# Patient Record
Sex: Male | Born: 1960 | Race: Black or African American | Hispanic: No | Marital: Married | Smoking: Never smoker
Health system: Southern US, Community
[De-identification: ages and names within clinical notes are randomized; demographics above are authoritative.]

## PROBLEM LIST (undated history)

## (undated) DIAGNOSIS — J449 Chronic obstructive pulmonary disease, unspecified: Secondary | ICD-10-CM

## (undated) DIAGNOSIS — N401 Enlarged prostate with lower urinary tract symptoms: Secondary | ICD-10-CM

## (undated) DIAGNOSIS — J302 Other seasonal allergic rhinitis: Secondary | ICD-10-CM

## (undated) DIAGNOSIS — C61 Malignant neoplasm of prostate: Secondary | ICD-10-CM

## (undated) DIAGNOSIS — I10 Essential (primary) hypertension: Secondary | ICD-10-CM

## (undated) DIAGNOSIS — M545 Low back pain: Secondary | ICD-10-CM

## (undated) DIAGNOSIS — E785 Hyperlipidemia, unspecified: Secondary | ICD-10-CM

## (undated) DIAGNOSIS — Z8619 Personal history of other infectious and parasitic diseases: Secondary | ICD-10-CM

## (undated) HISTORY — PX: PROSTATE BIOPSY: SHX241

## (undated) HISTORY — PX: NO PAST SURGERIES: SHX2092

## (undated) HISTORY — DX: Other seasonal allergic rhinitis: J30.2

## (undated) HISTORY — DX: Personal history of other infectious and parasitic diseases: Z86.19

## (undated) HISTORY — DX: Low back pain: M54.5

---

## 2013-01-22 ENCOUNTER — Encounter (HOSPITAL_COMMUNITY): Payer: Self-pay | Admitting: Emergency Medicine

## 2013-01-22 ENCOUNTER — Emergency Department (HOSPITAL_COMMUNITY): Payer: Medicare HMO

## 2013-01-22 ENCOUNTER — Emergency Department (HOSPITAL_COMMUNITY)
Admission: EM | Admit: 2013-01-22 | Discharge: 2013-01-22 | Disposition: A | Discharge status: Home / Self Care | Payer: Medicare HMO | Attending: Emergency Medicine | Admitting: Emergency Medicine

## 2013-01-22 DIAGNOSIS — R109 Unspecified abdominal pain: Secondary | ICD-10-CM | POA: Insufficient documentation

## 2013-01-22 DIAGNOSIS — M549 Dorsalgia, unspecified: Secondary | ICD-10-CM | POA: Insufficient documentation

## 2013-01-22 DIAGNOSIS — R1030 Lower abdominal pain, unspecified: Secondary | ICD-10-CM

## 2013-01-22 DIAGNOSIS — E785 Hyperlipidemia, unspecified: Secondary | ICD-10-CM | POA: Insufficient documentation

## 2013-01-22 DIAGNOSIS — I10 Essential (primary) hypertension: Secondary | ICD-10-CM | POA: Insufficient documentation

## 2013-01-22 HISTORY — DX: Essential (primary) hypertension: I10

## 2013-01-22 HISTORY — DX: Hyperlipidemia, unspecified: E78.5

## 2013-01-22 LAB — URINALYSIS, ROUTINE W REFLEX MICROSCOPIC
Hgb urine dipstick: NEGATIVE
Ketones, ur: NEGATIVE mg/dL
Leukocytes, UA: NEGATIVE
Protein, ur: NEGATIVE mg/dL
Urobilinogen, UA: 0.2 mg/dL (ref 0.0–1.0)

## 2013-01-22 LAB — CBC WITH DIFFERENTIAL/PLATELET
Basophils Absolute: 0.1 10*3/uL (ref 0.0–0.1)
Eosinophils Absolute: 0.2 10*3/uL (ref 0.0–0.7)
Eosinophils Relative: 2 % (ref 0–5)
MCH: 27.2 pg (ref 26.0–34.0)
MCHC: 33.5 g/dL (ref 30.0–36.0)
MCV: 81.1 fL (ref 78.0–100.0)
Monocytes Absolute: 1.3 10*3/uL — ABNORMAL HIGH (ref 0.1–1.0)
Platelets: 290 10*3/uL (ref 150–400)
RDW: 13.7 % (ref 11.5–15.5)

## 2013-01-22 LAB — BASIC METABOLIC PANEL
Calcium: 9.7 mg/dL (ref 8.4–10.5)
Creatinine, Ser: 1.2 mg/dL (ref 0.50–1.35)
GFR calc non Af Amer: 69 mL/min — ABNORMAL LOW (ref >90)
Sodium: 134 mEq/L — ABNORMAL LOW (ref 135–145)

## 2013-01-22 MED ORDER — OXYCODONE-ACETAMINOPHEN 5-325 MG PO TABS
2.0000 | ORAL_TABLET | Freq: Once | ORAL | Status: AC
  Administered 2013-01-22: 2 via ORAL
  Filled 2013-01-22: qty 2

## 2013-01-22 MED ORDER — OXYCODONE-ACETAMINOPHEN 5-325 MG PO TABS: 1.0000 | ORAL_TABLET | ORAL | Status: DC | PRN

## 2013-01-22 NOTE — ED Provider Notes (Signed)
History    CSN: [id number] Arrival date & time 01/22/13  1459  First MD Initiated Contact with Patient 01/22/13 1546     Chief Complaint  Patient presents with  . Groin Pain  . Back Pain    The history is provided by the patient.   patient began having bilateral radiating groin pain it radiates towards his scrotum and his perineal area that began today.  His pain began somewhat abruptly and this continued to worsen through the day.  It seems as though walking exacerbates his pain more than anything else.  No frank penile pain.  He denies dysuria or urinary frequency.  No fevers or chills.  No nausea or vomiting.  No diarrhea.  His pain is moderate to severe in severity at this time.  Nothing improves his pain.  He took both Aleve and ibuprofen prior to arrival without improvement in his symptoms   Past Medical History  Diagnosis Date  . Hypertension   . Hyperlipidemia    History reviewed. No pertinent past surgical history. No family history on file. History  Substance Use Topics  . Smoking status: Never Smoker   . Smokeless tobacco: Not on file  . Alcohol Use: No    Review of Systems  Musculoskeletal: Positive for back pain.  All other systems reviewed and are negative.    Allergies  Review of patient's allergies indicates no known allergies.  Home Medications  No current outpatient prescriptions on file. BP 118/70  Pulse 83  Temp(Src) 98.7 F (37.1 C) (Oral)  SpO2 96% Physical Exam  Nursing note and vitals reviewed. Constitutional: He is oriented to person, place, and time. He appears well-developed and well-nourished.  HENT:  Head: Normocephalic and atraumatic.  Eyes: EOM are normal.  Neck: Normal range of motion.  Cardiovascular: Normal rate, regular rhythm, normal heart sounds and intact distal pulses.   Pulmonary/Chest: Effort normal and breath sounds normal. No respiratory distress.  Abdominal: Soft. He exhibits no distension. There is no tenderness.   Genitourinary: Rectum normal.  Normal uncircumcised penis.  No tenderness along the shaft of his penis.  Normal scrotum.  Normal testicles bilaterally.  Normal perineal area without erythema warmth or tenderness or crepitus.  Musculoskeletal: Normal range of motion.  No lumbar or paralumbar tenderness.  Exacerbation of pain with abduction of his legs.   Neurological: He is alert and oriented to person, place, and time.  Skin: Skin is warm and dry.  Psychiatric: He has a normal mood and affect. Judgment normal.    ED Course  Procedures (including critical care time) Labs Reviewed  CBC WITH DIFFERENTIAL - Abnormal; Notable for the following:    WBC 12.1 (*)    Neutro Abs 9.0 (*)    Monocytes Absolute 1.3 (*)    All other components within normal limits  BASIC METABOLIC PANEL - Abnormal; Notable for the following:    Sodium 134 (*)    GFR calc non Af Amer 69 (*)    GFR calc Af Amer 80 (*)    All other components within normal limits  URINALYSIS, ROUTINE W REFLEX MICROSCOPIC   Ct Abdomen Pelvis Wo Contrast  01/22/2013   *RADIOLOGY REPORT*  Clinical Data: Groin pain and low back pain.  CT ABDOMEN AND PELVIS WITHOUT CONTRAST  Technique:  Multidetector CT imaging of the abdomen and pelvis was performed following the standard protocol without intravenous contrast.  Comparison: None.  Findings: There is no evidence of renal, ureteral or bladder calculi.  No  hydronephrosis identified.  Unenhanced appearance of the liver, gallbladder, pancreas, spleen, adrenal glands and bowel are unremarkable.  No abnormal fluid collections are identified.  No evidence of hernias, masses or enlarged lymph nodes.  The abdominal aorta is of normal caliber. Bony structures are unremarkable.  IMPRESSION: Normal CT of the abdomen and pelvis without contrast.   Original Report Authenticated By: Irish Lack, M.D.   1. Groin pain     MDM  I suspect this patient's pain is more related to musculoskeletal groin pain  possibly from working on his car yesterday.  However given his age and significant pain it is worthwhile to do a CT scan through his abdomen and pelvis to look for alternate pathology including the possibility of free intraperitoneal fluid that could be causing diffuse discomfort in his lower pelvis.  5:41 PM Pt feels better at this time, labs, urine and CT scan without acute pathology  Lyanne Co, MD 01/22/13 [phone]

## 2013-01-22 NOTE — ED Notes (Signed)
States that he began having groin pain and low back pain this am. States that when he walks the pain is worse. Denies dysuria or recent fall.

## 2013-01-22 NOTE — ED Notes (Signed)
Patient transported to CT 

## 2013-01-22 NOTE — Progress Notes (Signed)
   CARE MANAGEMENT ED NOTE 01/22/2013  Patient:  David [name],David [name]   Account Number:  [id number]  Date Initiated:  01/22/2013  Documentation initiated by:  Radford Pax  Subjective/Objective Assessment:   Patient presented to ED with groin and lower back pain     Subjective/Objective Assessment Detail:     Action/Plan:   Action/Plan Detail:   Anticipated DC Date:       Status Recommendation to Physician:   Result of Recommendation:    Other ED Services  Consult Working Plan    DC Planning Services  Other  PCP issues    Choice offered to / List presented to:            Status of service:  Completed, signed off  ED Comments:   ED Comments Detail:  Patient listed as not having a pcp.  EDCM spoke to patient who stated that David [name] of [facility] is his pcp. No further needs at this time.

## 2013-01-26 ENCOUNTER — Emergency Department (HOSPITAL_COMMUNITY)
Admission: EM | Admit: 2013-01-26 | Discharge: 2013-01-26 | Disposition: A | Discharge status: Home / Self Care | Payer: Managed Care, Other (non HMO) | Attending: Emergency Medicine | Admitting: Emergency Medicine

## 2013-01-26 ENCOUNTER — Encounter (HOSPITAL_COMMUNITY): Payer: Self-pay | Admitting: *Deleted

## 2013-01-26 ENCOUNTER — Emergency Department (HOSPITAL_COMMUNITY): Payer: Managed Care, Other (non HMO)

## 2013-01-26 DIAGNOSIS — M549 Dorsalgia, unspecified: Secondary | ICD-10-CM

## 2013-01-26 DIAGNOSIS — E785 Hyperlipidemia, unspecified: Secondary | ICD-10-CM | POA: Insufficient documentation

## 2013-01-26 DIAGNOSIS — IMO0002 Reserved for concepts with insufficient information to code with codable children: Secondary | ICD-10-CM | POA: Insufficient documentation

## 2013-01-26 DIAGNOSIS — R109 Unspecified abdominal pain: Secondary | ICD-10-CM | POA: Insufficient documentation

## 2013-01-26 DIAGNOSIS — R748 Abnormal levels of other serum enzymes: Secondary | ICD-10-CM | POA: Insufficient documentation

## 2013-01-26 DIAGNOSIS — Z79899 Other long term (current) drug therapy: Secondary | ICD-10-CM | POA: Insufficient documentation

## 2013-01-26 DIAGNOSIS — I10 Essential (primary) hypertension: Secondary | ICD-10-CM | POA: Insufficient documentation

## 2013-01-26 LAB — CBC WITH DIFFERENTIAL/PLATELET
Basophils Absolute: 0 10*3/uL (ref 0.0–0.1)
Basophils Relative: 0 % (ref 0–1)
Eosinophils Absolute: 0 10*3/uL (ref 0.0–0.7)
Eosinophils Relative: 0 % (ref 0–5)
HCT: 39.2 % (ref 39.0–52.0)
Lymphocytes Relative: 7 % — ABNORMAL LOW (ref 12–46)
MCHC: 34.4 g/dL (ref 30.0–36.0)
MCV: 77.3 fL — ABNORMAL LOW (ref 78.0–100.0)
Monocytes Absolute: 1.9 10*3/uL — ABNORMAL HIGH (ref 0.1–1.0)
Platelets: 155 10*3/uL (ref 150–400)
RDW: 13.8 % (ref 11.5–15.5)
WBC: 11.1 10*3/uL — ABNORMAL HIGH (ref 4.0–10.5)

## 2013-01-26 LAB — COMPREHENSIVE METABOLIC PANEL
ALT: 36 U/L (ref 0–53)
AST: 38 U/L — ABNORMAL HIGH (ref 0–37)
Albumin: 3.1 g/dL — ABNORMAL LOW (ref 3.5–5.2)
CO2: 27 mEq/L (ref 19–32)
Calcium: 9.3 mg/dL (ref 8.4–10.5)
Creatinine, Ser: 1.39 mg/dL — ABNORMAL HIGH (ref 0.50–1.35)
GFR calc non Af Amer: 58 mL/min — ABNORMAL LOW (ref >90)
Sodium: 130 mEq/L — ABNORMAL LOW (ref 135–145)
Total Protein: 7.2 g/dL (ref 6.0–8.3)

## 2013-01-26 LAB — URINALYSIS, ROUTINE W REFLEX MICROSCOPIC
Glucose, UA: NEGATIVE mg/dL
Leukocytes, UA: NEGATIVE
Protein, ur: 30 mg/dL — AB
Specific Gravity, Urine: 1.016 (ref 1.005–1.030)
pH: 5 (ref 5.0–8.0)

## 2013-01-26 LAB — URINE MICROSCOPIC-ADD ON

## 2013-01-26 MED ORDER — CYCLOBENZAPRINE HCL 5 MG PO TABS: 5.0000 mg | ORAL_TABLET | Freq: Three times a day (TID) | ORAL | Status: DC | PRN

## 2013-01-26 MED ORDER — OXYCODONE-ACETAMINOPHEN 10-325 MG PO TABS: 1.0000 | ORAL_TABLET | ORAL | Status: DC | PRN

## 2013-01-26 MED ORDER — MORPHINE SULFATE 4 MG/ML IJ SOLN
4.0000 mg | Freq: Once | INTRAMUSCULAR | Status: AC
  Administered 2013-01-26: 4 mg via INTRAVENOUS
  Filled 2013-01-26: qty 1

## 2013-01-26 NOTE — ED Provider Notes (Signed)
History    CSN: [id number] Arrival date & time 01/26/13  1153  First MD Initiated Contact with Patient 01/26/13 1212     Chief Complaint  Patient presents with  . Back Pain  . Groin Pain    HPI  Pt started having pain in his lower back and abdomen last week.  He came to the ED on Thursday and had a CT scan, labs which were all normal.  The pain never went away.  In the last day or two when moving the pain increased and ratiated down into his groin and his scrotum. Pt now noticed he has a little it of pain in his left shoulder but thinks it is the way he is moving.   The pain gets worse with walking or twisting.  Worse when trying to move his right leg. Past Medical History  Diagnosis Date  . Hypertension   . Hyperlipidemia    History reviewed. No pertinent past surgical history. History reviewed. No pertinent family history. History  Substance Use Topics  . Smoking status: Never Smoker   . Smokeless tobacco: Not on file  . Alcohol Use: No    Review of Systems  All other systems reviewed and are negative.    Allergies  Review of patient's allergies indicates no known allergies.  Home Medications   Current Outpatient Rx  Name  Route  Sig  Dispense  Refill  . losartan-hydrochlorothiazide (HYZAAR) 50-12.5 MG per tablet   Oral   Take 1 tablet by mouth every morning.         . naproxen sodium (ANAPROX) 220 MG tablet   Oral   Take 440 mg by mouth every 8 (eight) hours as needed (pain).         . simvastatin (ZOCOR) 20 MG tablet   Oral   Take 20 mg by mouth daily.         . cyclobenzaprine (FLEXERIL) 5 MG tablet   Oral   Take 1 tablet (5 mg total) by mouth 3 (three) times daily as needed for muscle spasms.   21 tablet   0   . oxyCODONE-acetaminophen (PERCOCET) 10-325 MG per tablet   Oral   Take 1 tablet by mouth every 4 (four) hours as needed for pain.   30 tablet   0    BP 145/88  Pulse 116  Temp(Src) 99 F (37.2 C) (Oral)  Resp 20  SpO2  95% Physical Exam  Nursing note and vitals reviewed. Constitutional: He appears well-developed and well-nourished. No distress.  HENT:  Head: Normocephalic and atraumatic.  Right Ear: External ear normal.  Left Ear: External ear normal.  Nose: Nose normal.  Eyes: Conjunctivae and EOM are normal. Right eye exhibits no discharge. Left eye exhibits no discharge. No scleral icterus.  Neck: Neck supple. No tracheal deviation present.  Cardiovascular: Normal rate, regular rhythm and intact distal pulses.   Pulmonary/Chest: Effort normal and breath sounds normal. No stridor. No respiratory distress. He has no wheezes. He has no rales.  Abdominal: Soft. Bowel sounds are normal. He exhibits no distension. There is no tenderness. There is no rebound and no guarding.  Musculoskeletal: He exhibits no edema and no tenderness.       Lumbar back: He exhibits decreased range of motion, tenderness, pain and spasm. He exhibits no swelling and no edema.  Pain when lifting right leg off the bed and trying to turn over in bed  Neurological: He is alert. He has normal strength. He is  not disoriented. No sensory deficit. Cranial nerve deficit:  no gross defecits noted. He exhibits normal muscle tone. He displays no seizure activity. Coordination normal.  Reflex Scores:      Patellar reflexes are 2+ on the right side and 2+ on the left side.      Achilles reflexes are 2+ on the right side and 2+ on the left side. Skin: Skin is warm and dry. No rash noted. He is not diaphoretic. No erythema.  Psychiatric: He has a normal mood and affect. His behavior is normal. Thought content normal.    ED Course  Procedures (including critical care time) Labs Reviewed  CBC WITH DIFFERENTIAL - Abnormal; Notable for the following:    WBC 11.1 (*)    MCV 77.3 (*)    Neutro Abs 8.4 (*)    Lymphocytes Relative 7 (*)    Monocytes Relative 17 (*)    Monocytes Absolute 1.9 (*)    All other components within normal limits   COMPREHENSIVE METABOLIC PANEL - Abnormal; Notable for the following:    Sodium 130 (*)    Potassium 3.1 (*)    Chloride 91 (*)    Glucose, Bld 170 (*)    Creatinine, Ser 1.39 (*)    Albumin 3.1 (*)    AST 38 (*)    Alkaline Phosphatase 142 (*)    Total Bilirubin 3.0 (*)    GFR calc non Af Amer 58 (*)    GFR calc Af Amer 67 (*)    All other components within normal limits  URINALYSIS, ROUTINE W REFLEX MICROSCOPIC - Abnormal; Notable for the following:    Hgb urine dipstick TRACE (*)    Protein, ur 30 (*)    All other components within normal limits  URINE MICROSCOPIC-ADD ON - Abnormal; Notable for the following:    Squamous Epithelial / LPF FEW (*)    Bacteria, UA FEW (*)    All other components within normal limits  LIPASE, BLOOD   US Abdomen Complete  01/26/2013   *RADIOLOGY REPORT*  Clinical Data:  Abdominal pain, elevated LFTs  COMPLETE ABDOMINAL ULTRASOUND  Comparison:  CT abdomen pelvis dated 01/22/2013  Findings:  Gallbladder:  No gallstones, gallbladder wall thickening, or pericholecystic fluid.  Negative sonographic Murphy's sign.  Common bile duct:  Measures 3 mm.  Liver:  No focal lesion identified.  Within normal limits in parenchymal echogenicity.  IVC:  Appears normal.  Pancreas:  Not visualized due to overlying bowel gas.  Spleen:  Measures 6.2 cm.  Right Kidney:  Measures 12.2 cm.  No mass or hydronephrosis.  Left Kidney:  Measures 11.7 cm.  No mass or hydronephrosis.  Abdominal aorta:  Incompletely visualized.  No aneurysm identified.  IMPRESSION: Negative abdominal ultrasound.   Original Report Authenticated By: Charline Bills, M.D.   1. Back pain   2. Elevated LFTs     MDM  Patient's symptoms may be related to a lumbar radiculopathy. He has lower back pain that radiates down towards his lower leg. The pain does increase with movement. Patient does not have any focal weakness. I doubt any acute infection. Do not feel that he needs emergent MRI imaging. The patient  certainly may benefit from further outpatient evaluation. I will give him a referral to a neurosurgeon. Also suggested he followup with his primary Dr. regarding his LFTs.  Do not feel that his abnormal LFT values are related to his condition today. He does not have any evidence of abnormality on the CT  scan and the ultrasound today was also normal.  Celene Kras, MD 01/26/13 [phone]

## 2013-01-26 NOTE — ED Notes (Signed)
MD at bedside. 

## 2013-01-26 NOTE — ED Notes (Addendum)
Pt reports he started having lower back pain that radiates to groin. Came to ED on Thursday. Had CT done, dx as a muscular injury. Today pt unable to walk or move without severe pain. Pain 10/10.

## 2015-01-16 IMAGING — CT CT ABD-PELV W/O CM
1 series · 16 of 28 positions shown, 20 images · non-contrast
Comparison: None.

CLINICAL DATA: Groin pain and low back pain.

CT ABDOMEN AND PELVIS WITHOUT CONTRAST
TECHNIQUE: Multidetector CT imaging of the abdomen and pelvis was
performed following the standard protocol without intravenous
contrast.

[Series 6: lung · axial · 0.74mm/px · z∈[-158,-33]mm · 16 of 28 slices shown, 20 images]
[im 2/28  soft-tissue]
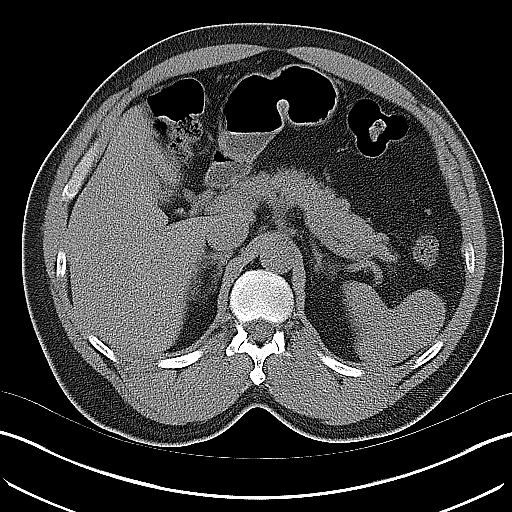
[im 2/28  bone]
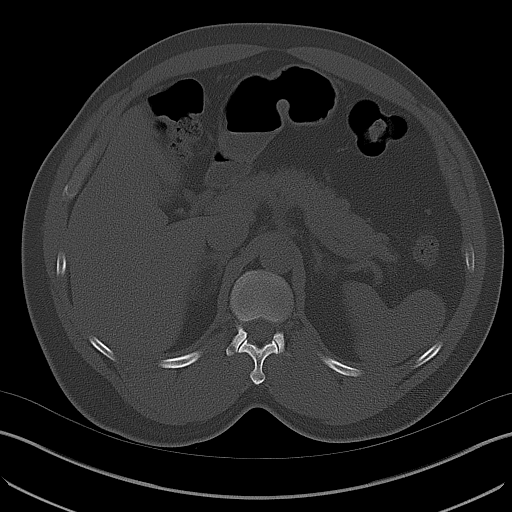
[im 4/28  soft-tissue]
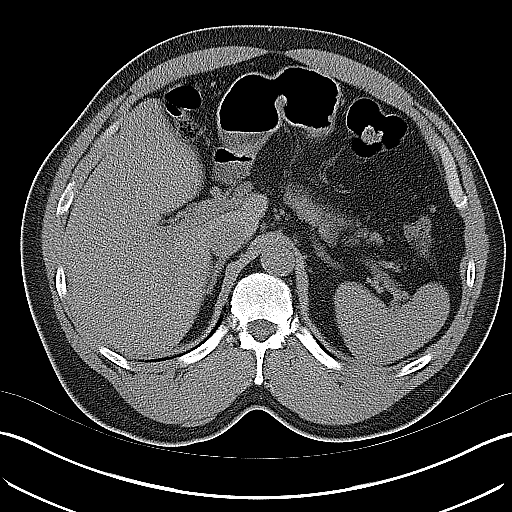
[im 6/28  soft-tissue]
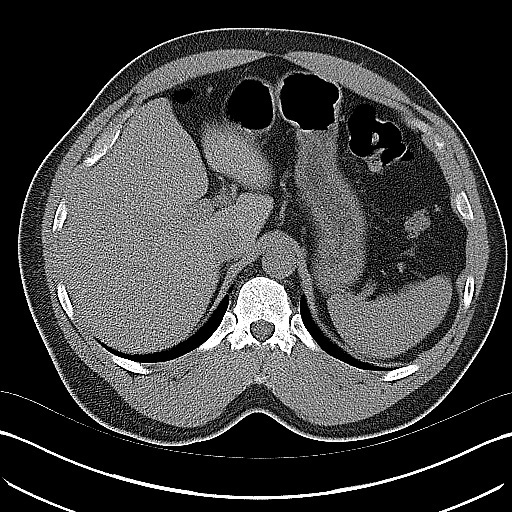
[im 8/28  soft-tissue]
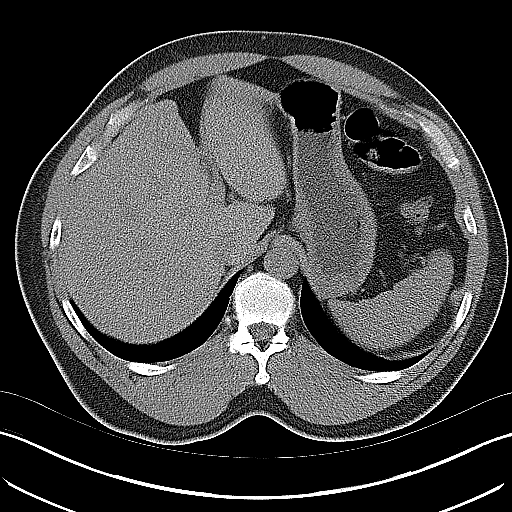
[im 10/28  soft-tissue]
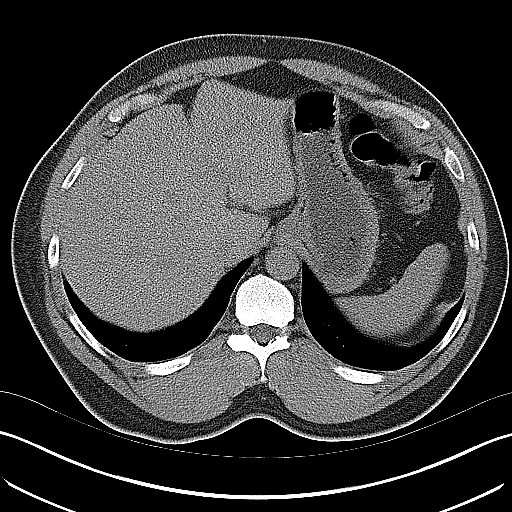
[im 12/28  soft-tissue]
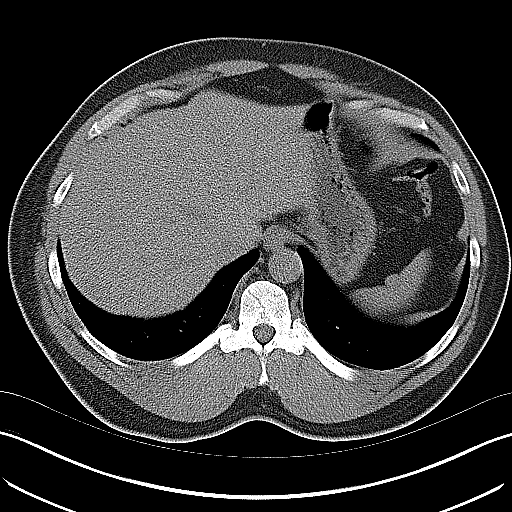
[im 14/28  soft-tissue]
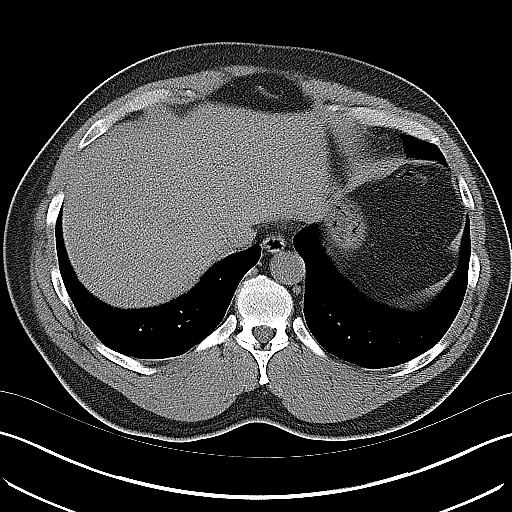
[im 15/28  soft-tissue]
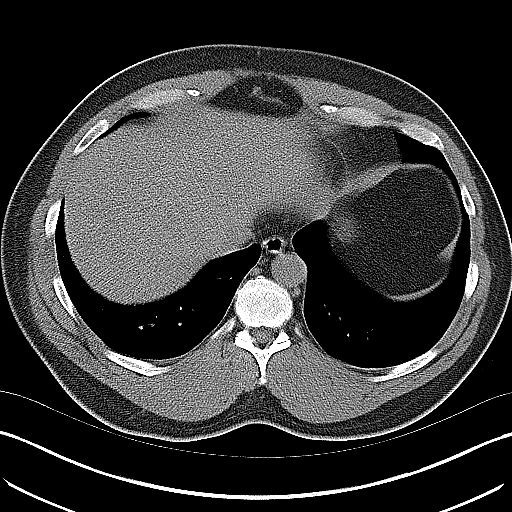
[im 17/28  soft-tissue]
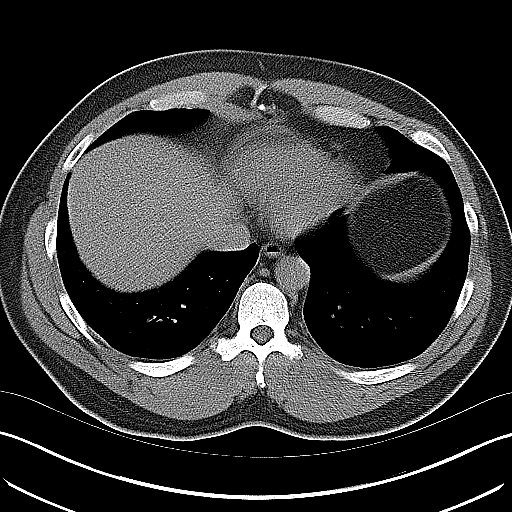
[im 17/28  bone]
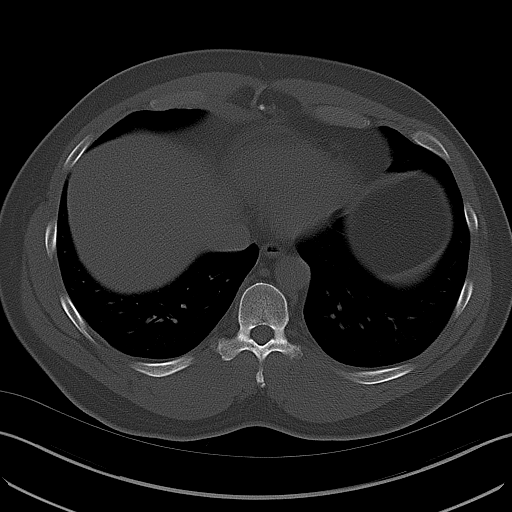
[im 19/28  soft-tissue]
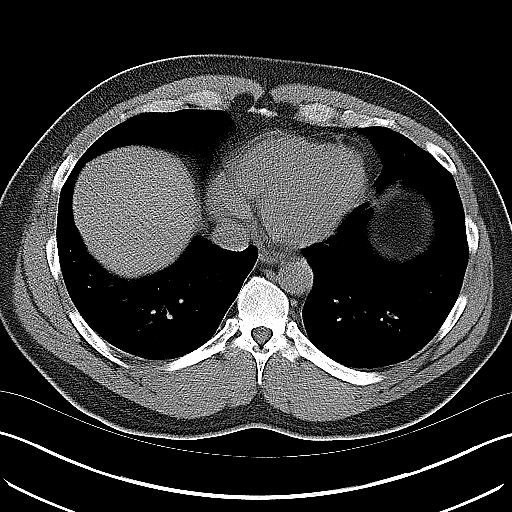
[im 21/28  soft-tissue]
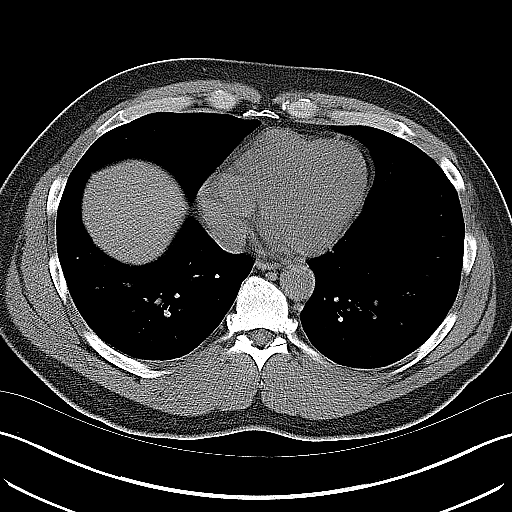
[im 23/28  soft-tissue]
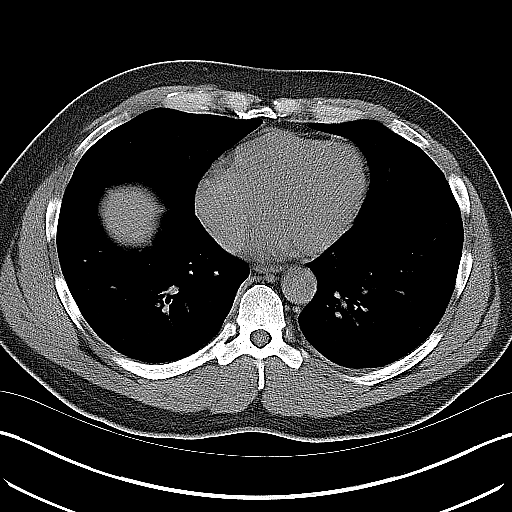
[im 24/28  lung]
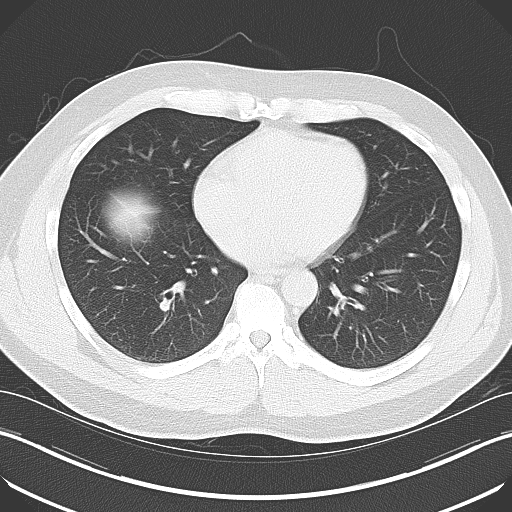
[im 25/28  soft-tissue]
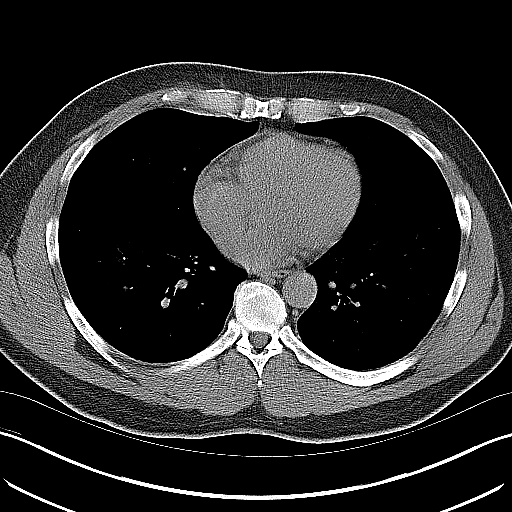
[im 25/28  lung]
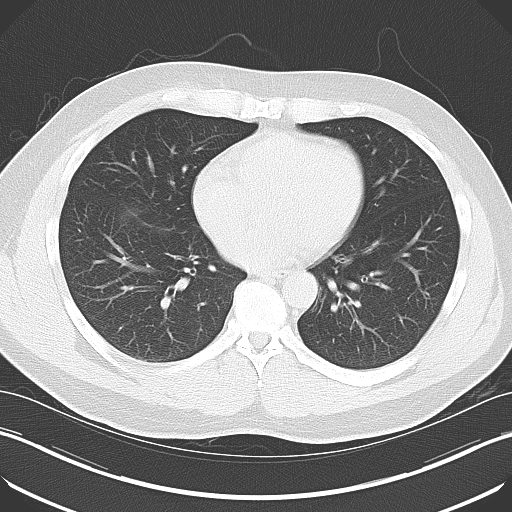
[im 26/28  lung]
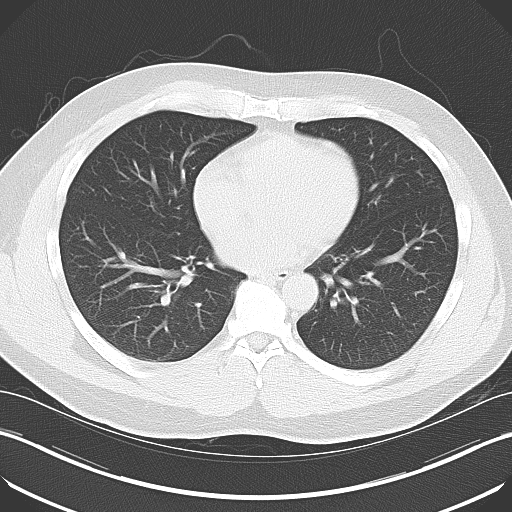
[im 27/28  soft-tissue]
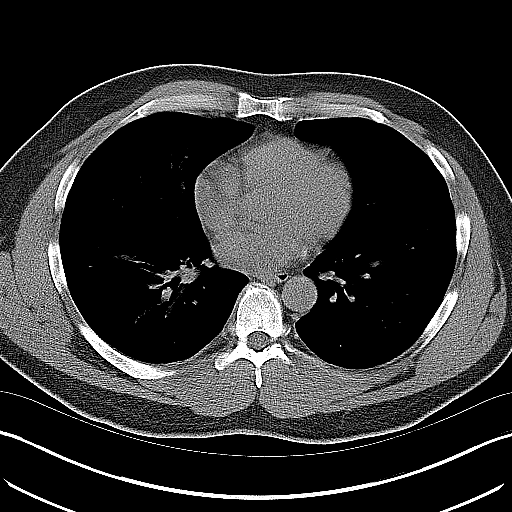
[im 27/28  lung]
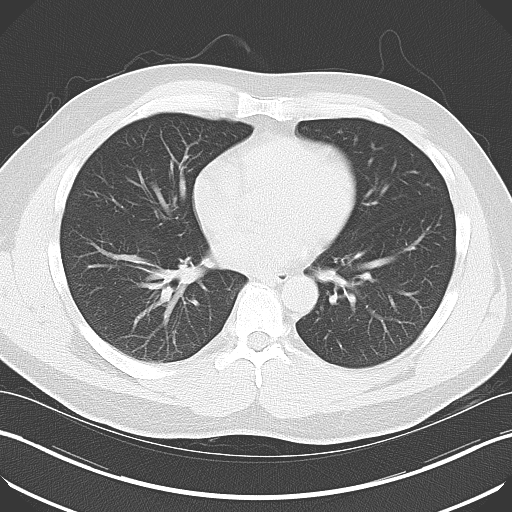

[16 of 28 positions shown; findings below may reference images not displayed]

FINDINGS: There is no evidence of renal, ureteral or bladder
calculi.  No hydronephrosis identified.

Unenhanced appearance of the liver, gallbladder, pancreas, spleen,
adrenal glands and bowel are unremarkable.  No abnormal fluid
collections are identified.  No evidence of hernias, masses or
enlarged lymph nodes.  The abdominal aorta is of normal caliber.
Bony structures are unremarkable.
IMPRESSION: Normal CT of the abdomen and pelvis without contrast.

## 2015-01-20 IMAGING — US US ABDOMEN COMPLETE
1 series · 14 of 25 positions shown · non-contrast
Comparison: CT abdomen pelvis dated 01/22/2013

CLINICAL DATA: Abdominal pain, elevated LFTs

COMPLETE ABDOMINAL ULTRASOUND

[Series 1: us abdomen complete · 0.32mm/px · 14 of 54 slices shown]
[im 1/54]
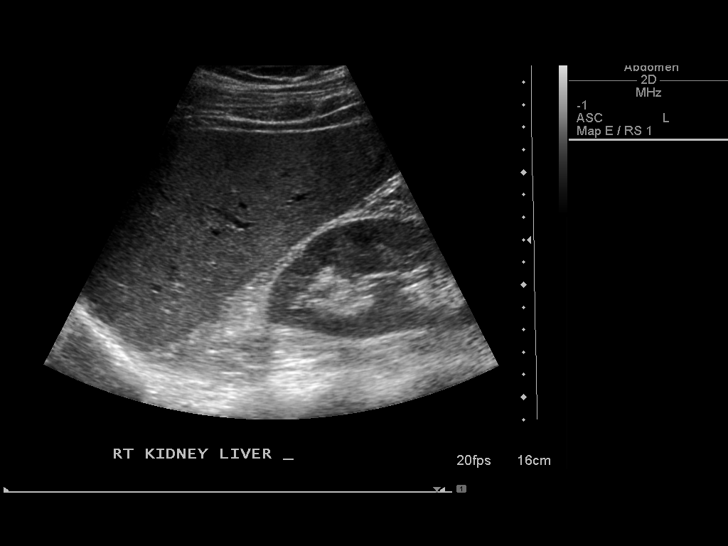
[im 5/54]
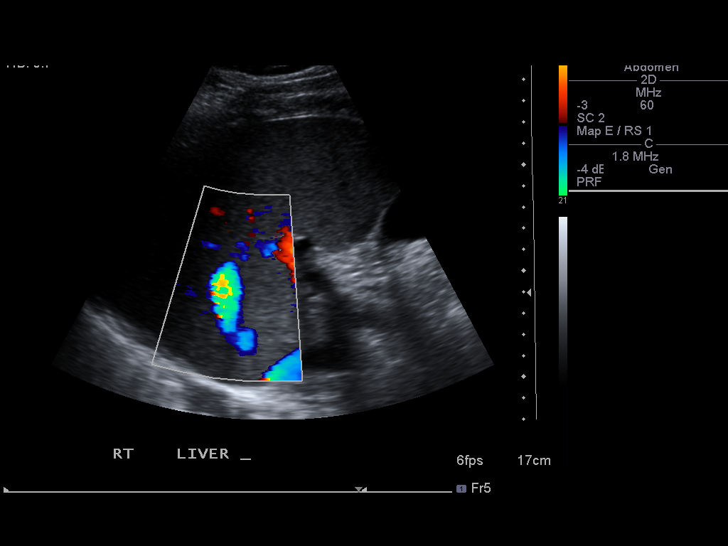
[im 9/54]
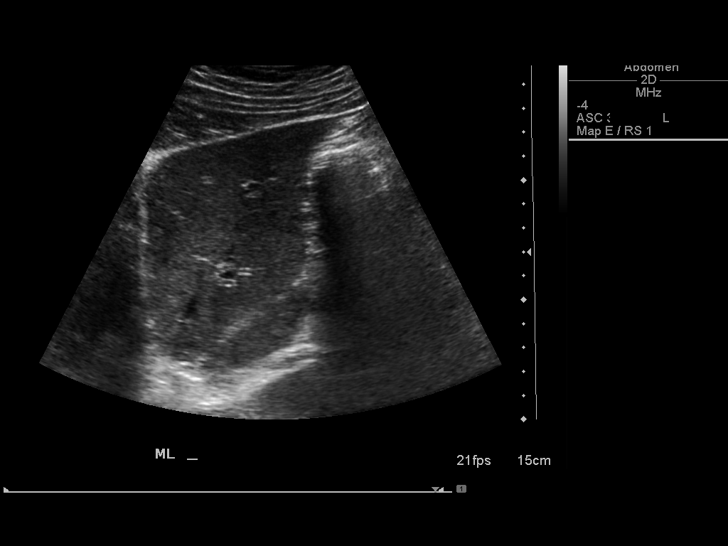
[im 14/54]
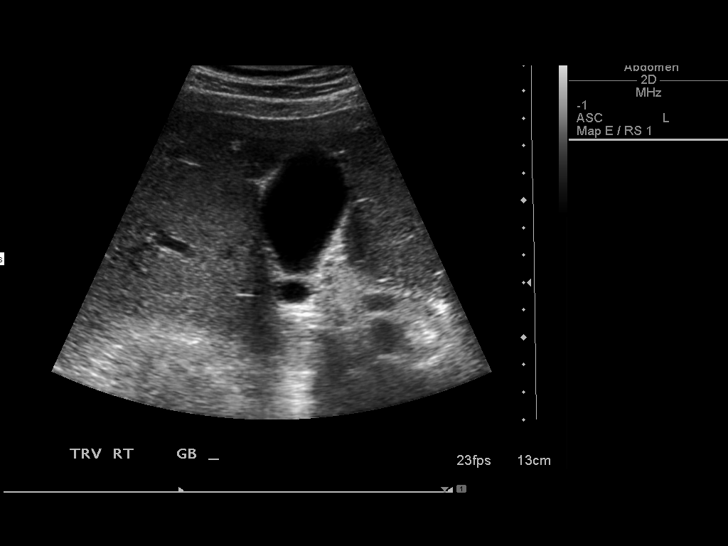
[im 18/54]
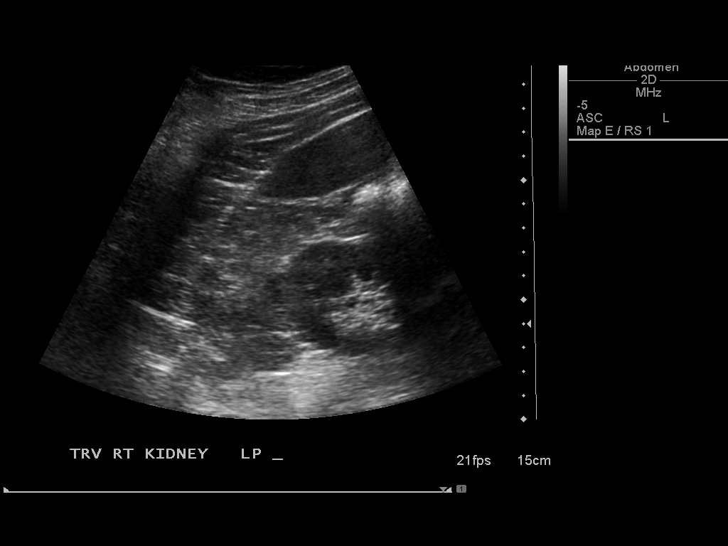
[im 20/54]
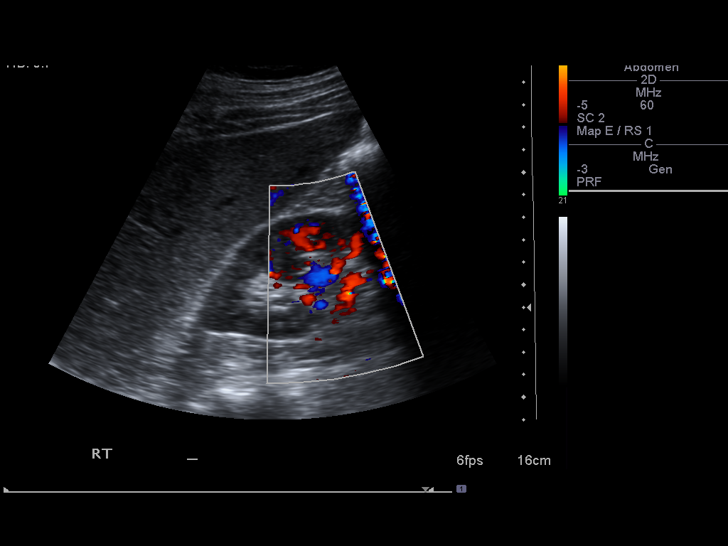
[im 25/54]
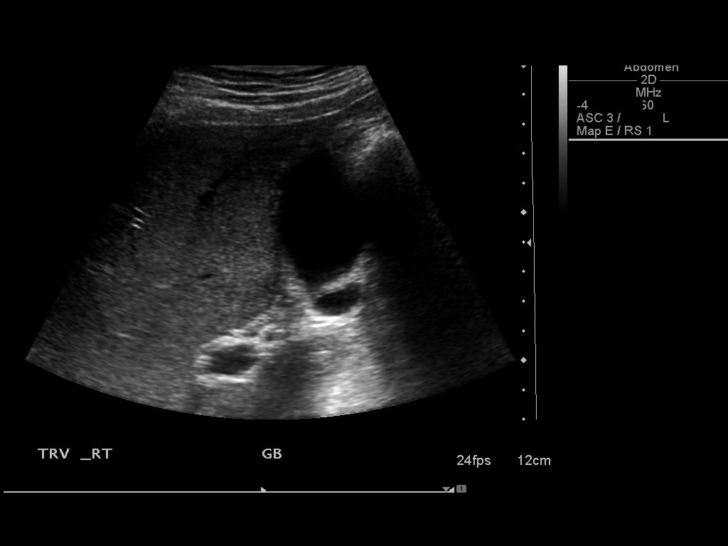
[im 29/54]
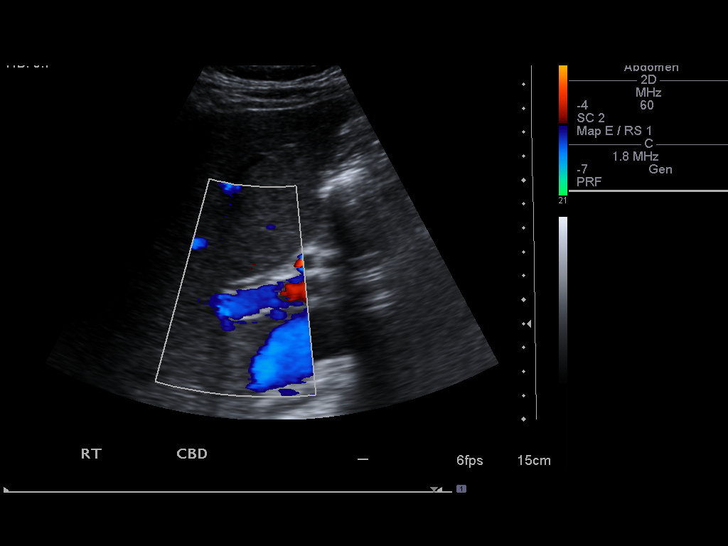
[im 34/54]
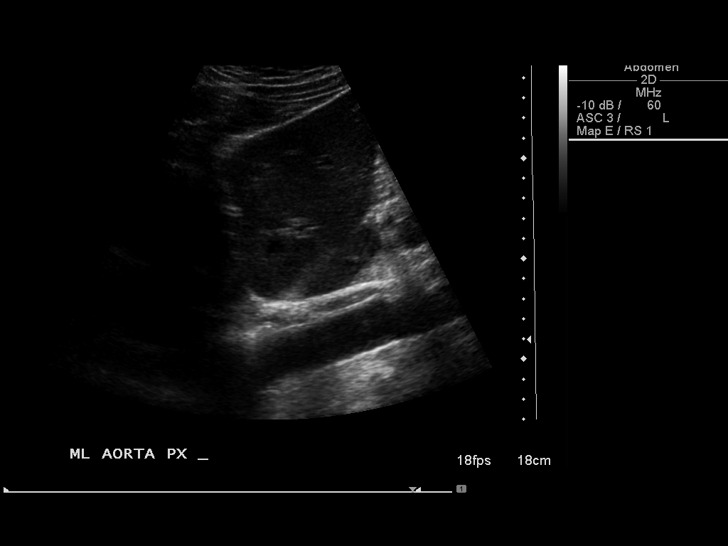
[im 36/54]
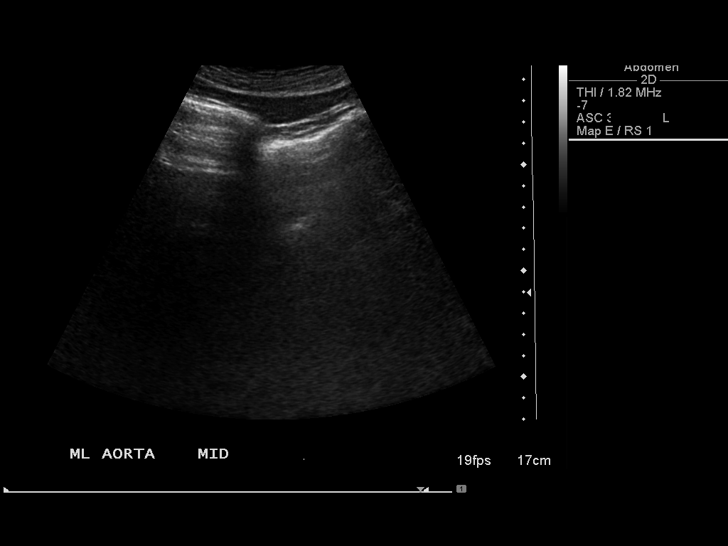
[im 40/54]
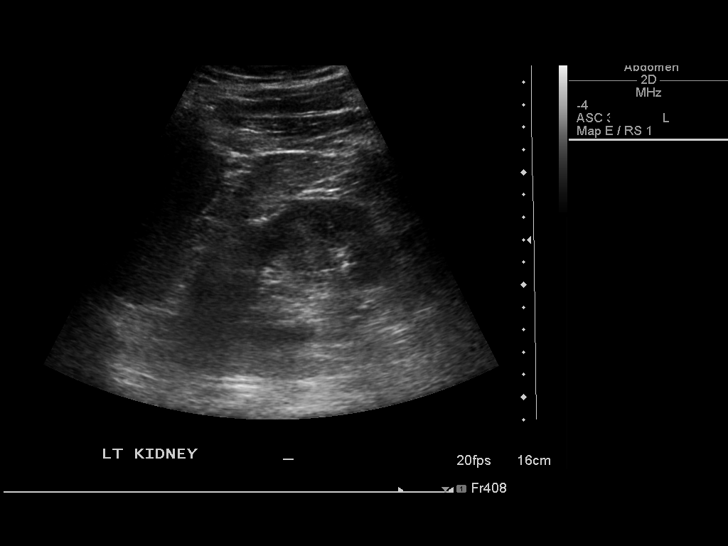
[im 45/54]
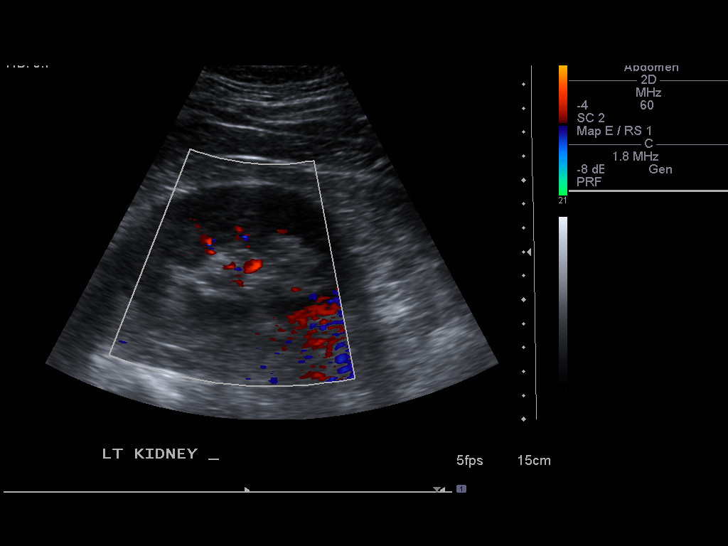
[im 49/54]
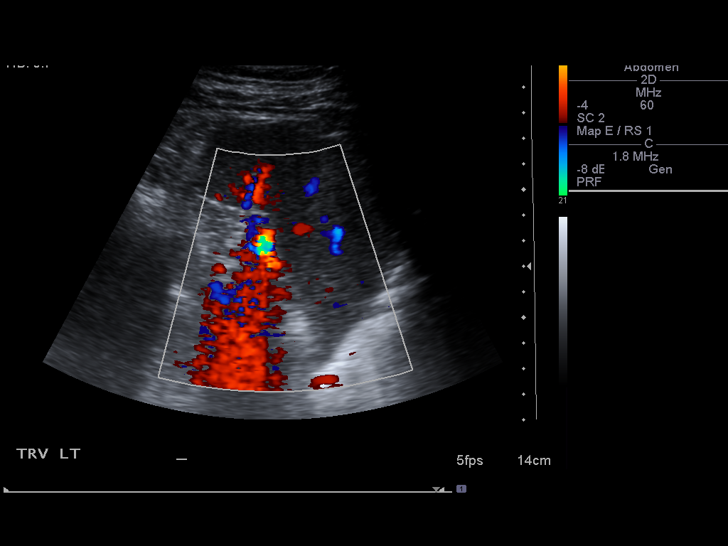
[im 54/54]
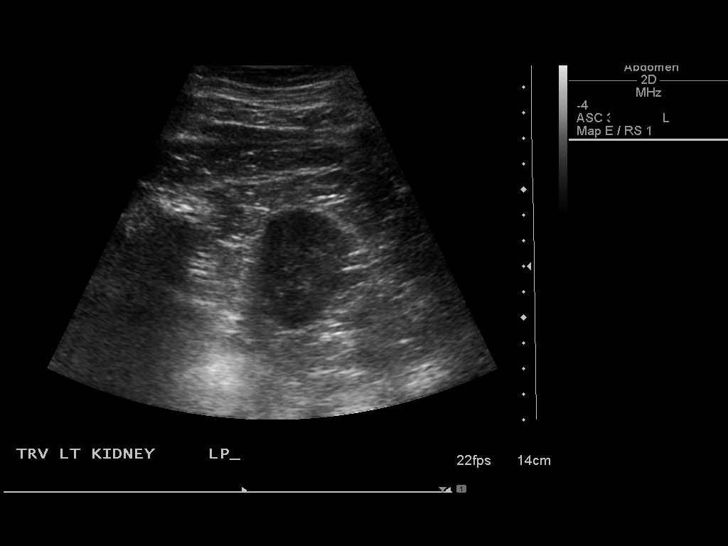

[14 of 25 positions shown; findings below may reference images not displayed]

FINDINGS: Gallbladder:  No gallstones, gallbladder wall thickening, or
pericholecystic fluid.  Negative sonographic Murphy's sign.

Common bile duct:  Measures 3 mm.

Liver:  No focal lesion identified.  Within normal limits in
parenchymal echogenicity.

IVC:  Appears normal.

Pancreas:  Not visualized due to overlying bowel gas.

Spleen:  Measures 6.2 cm.

Right Kidney:  Measures 12.2 cm.  No mass or hydronephrosis.

Left Kidney:  Measures 11.7 cm.  No mass or hydronephrosis.

Abdominal aorta:  Incompletely visualized.  No aneurysm identified.
IMPRESSION: Negative abdominal ultrasound.

## 2015-11-25 ENCOUNTER — Telehealth: Payer: Self-pay | Admitting: *Deleted

## 2015-11-25 NOTE — Telephone Encounter (Signed)
Unable to reach patient at time of pre-visit call. Left message for patient to return call when available.  

## 2015-11-28 ENCOUNTER — Encounter: Payer: Self-pay | Admitting: Physician Assistant

## 2015-11-28 ENCOUNTER — Ambulatory Visit (INDEPENDENT_AMBULATORY_CARE_PROVIDER_SITE_OTHER): Payer: 59 | Admitting: Physician Assistant

## 2015-11-28 VITALS — BP 130/84 | HR 86 | Temp 98.3°F | Resp 16 | Ht 70.0 in | Wt 234.4 lb

## 2015-11-28 DIAGNOSIS — I10 Essential (primary) hypertension: Secondary | ICD-10-CM | POA: Diagnosis not present

## 2015-11-28 DIAGNOSIS — E785 Hyperlipidemia, unspecified: Secondary | ICD-10-CM | POA: Diagnosis not present

## 2015-11-28 DIAGNOSIS — IMO0002 Reserved for concepts with insufficient information to code with codable children: Secondary | ICD-10-CM | POA: Insufficient documentation

## 2015-11-28 DIAGNOSIS — E668 Other obesity: Secondary | ICD-10-CM

## 2015-11-28 DIAGNOSIS — Z1211 Encounter for screening for malignant neoplasm of colon: Secondary | ICD-10-CM | POA: Diagnosis not present

## 2015-11-28 MED ORDER — SIMVASTATIN 20 MG PO TABS: 20.0000 mg | ORAL_TABLET | Freq: Every day | ORAL | Status: DC

## 2015-11-28 MED ORDER — LOSARTAN POTASSIUM-HCTZ 50-12.5 MG PO TABS: 1.0000 | ORAL_TABLET | Freq: Every morning | ORAL | Status: DC

## 2015-11-28 MED ORDER — BECLOMETHASONE DIPROPIONATE 40 MCG/ACT IN AERS: 2.0000 | INHALATION_SPRAY | Freq: Every day | RESPIRATORY_TRACT | Status: DC | PRN

## 2015-11-28 NOTE — Patient Instructions (Signed)
Please continue medications as directed. Increase fluids and work on watching the diet. I recommend 150 minutes of aerobic exercise per week to promote healthy weight.  Follow-up with me in 6 months. Return sooner if needed. I will call once I have your records from [place] and have reviewed them.  You will be contacted by Gastroenterology for a colonoscopy.  DASH Eating Plan DASH stands for "Dietary Approaches to Stop Hypertension." The DASH eating plan is a healthy eating plan that has been shown to reduce high blood pressure (hypertension). Additional health benefits may include reducing the risk of type 2 diabetes mellitus, heart disease, and stroke. The DASH eating plan may also help with weight loss. WHAT DO I NEED TO KNOW ABOUT THE DASH EATING PLAN? For the DASH eating plan, you will follow these general guidelines:  Choose foods with a percent daily value for sodium of less than 5% (as listed on the food label).  Use salt-free seasonings or herbs instead of table salt or sea salt.  Check with your health care provider or pharmacist before using salt substitutes.  Eat lower-sodium products, often labeled as "lower sodium" or "no salt added."  Eat fresh foods.  Eat more vegetables, fruits, and low-fat dairy products.  Choose whole grains. Look for the word "whole" as the first word in the ingredient list.  Choose fish and skinless chicken or [place] more often than red meat. Limit fish, poultry, and meat to 6 oz (170 g) each day.  Limit sweets, desserts, sugars, and sugary drinks.  Choose heart-healthy fats.  Limit cheese to 1 oz (28 g) per day.  Eat more home-cooked food and less restaurant, buffet, and fast food.  Limit fried foods.  Cook foods using methods other than frying.  Limit canned vegetables. If you do use them, rinse them well to decrease the sodium.  When eating at a restaurant, ask that your food be prepared with less salt, or no salt if possible. WHAT  FOODS CAN I EAT? Seek help from a dietitian for individual calorie needs. Grains Whole grain or whole wheat bread. Brown rice. Whole grain or whole wheat pasta. Quinoa, bulgur, and whole grain cereals. Low-sodium cereals. Corn or whole wheat flour tortillas. Whole grain cornbread. Whole grain crackers. Low-sodium crackers. Vegetables Fresh or frozen vegetables (raw, steamed, roasted, or grilled). Low-sodium or reduced-sodium tomato and vegetable juices. Low-sodium or reduced-sodium tomato sauce and paste. Low-sodium or reduced-sodium canned vegetables.  Fruits All fresh, canned (in natural juice), or frozen fruits. Meat and Other Protein Products Ground beef (85% or leaner), grass-fed beef, or beef trimmed of fat. Skinless chicken or [place]. Ground chicken or [place]. Pork trimmed of fat. All fish and seafood. Eggs. Dried beans, peas, or lentils. Unsalted nuts and seeds. Unsalted canned beans. Dairy Low-fat dairy products, such as skim or 1% milk, 2% or reduced-fat cheeses, low-fat ricotta or cottage cheese, or plain low-fat yogurt. Low-sodium or reduced-sodium cheeses. Fats and Oils Tub margarines without trans fats. Light or reduced-fat mayonnaise and salad dressings (reduced sodium). Avocado. Safflower, olive, or canola oils. Natural peanut or almond butter. Other Unsalted popcorn and pretzels. The items listed above may not be a complete list of recommended foods or beverages. Contact your dietitian for more options. WHAT FOODS ARE NOT RECOMMENDED? Grains White bread. White pasta. White rice. Refined cornbread. Bagels and croissants. Crackers that contain trans fat. Vegetables Creamed or fried vegetables. Vegetables in a cheese sauce. Regular canned vegetables. Regular canned tomato sauce and paste. Regular tomato and  vegetable juices. Fruits Dried fruits. Canned fruit in light or heavy syrup. Fruit juice. Meat and Other Protein Products Fatty cuts of meat. Ribs, chicken wings, bacon,  sausage, bologna, salami, chitterlings, fatback, hot dogs, bratwurst, and packaged luncheon meats. Salted nuts and seeds. Canned beans with salt. Dairy Whole or 2% milk, cream, half-and-half, and cream cheese. Whole-fat or sweetened yogurt. Full-fat cheeses or blue cheese. Nondairy creamers and whipped toppings. Processed cheese, cheese spreads, or cheese curds. Condiments Onion and garlic salt, seasoned salt, table salt, and sea salt. Canned and packaged gravies. Worcestershire sauce. Tartar sauce. Barbecue sauce. Teriyaki sauce. Soy sauce, including reduced sodium. Steak sauce. Fish sauce. Oyster sauce. Cocktail sauce. Horseradish. Ketchup and mustard. Meat flavorings and tenderizers. Bouillon cubes. Hot sauce. Tabasco sauce. Marinades. Taco seasonings. Relishes. Fats and Oils Butter, stick margarine, lard, shortening, ghee, and bacon fat. Coconut, palm kernel, or palm oils. Regular salad dressings. Other Pickles and olives. Salted popcorn and pretzels. The items listed above may not be a complete list of foods and beverages to avoid. Contact your dietitian for more information. WHERE CAN I FIND MORE INFORMATION? National Heart, Lung, and Blood Institute: travelstabloid.com   This information is not intended to replace advice given to you by your health care provider. Make sure you discuss any questions you have with your health care provider.   Document Released: 07/05/2011 Document Revised: 08/06/2014 Document Reviewed: 05/20/2013 Elsevier Interactive Patient Education [employer].

## 2015-11-28 NOTE — Assessment & Plan Note (Signed)
Continue Simvastatin. Diet and exercise reviewed. Will obtain records for old PCP to review recent labs.

## 2015-11-28 NOTE — Assessment & Plan Note (Signed)
Body mass index is 33.63 kg/(m^2). Meal planning guide given. Recommend 150 minutes aerobic exercise per week.

## 2015-11-28 NOTE — Assessment & Plan Note (Signed)
Controlled. Asymptomatic.  Continue current medications.

## 2015-11-28 NOTE — Progress Notes (Signed)
Pre visit review using our clinic review tool, if applicable. No additional management support is needed unless otherwise documented below in the visit note/SLS  

## 2015-11-28 NOTE — Assessment & Plan Note (Signed)
Referral to Gastroenterology placed for screening colonoscopy. Average risk. Asymptomatic.

## 2015-11-28 NOTE — Progress Notes (Signed)
Patient presents to clinic today to establish care.  Acute Concerns: Denies acute concerns today.  Chronic Issues: Hypertension -- Is currently on Losartan-HCTZ 50-12.5 mg daily. Endorses taking medication as directed. Patient denies chest pain, palpitations, lightheadedness, dizziness, vision changes or frequent headaches. Denies history of stroke or heart attack.   BP Readings from Last 3 Encounters:  11/28/15 130/84  01/26/13 113/61  01/22/13 137/69   Hyperlipidemia -- Is currently on Simvastatin 20 mg daily. Endorses taking as directed. Denies myalgias with medications. Body mass index is 33.63 kg/(m^2). Endorses diet is ok overall but could improved. Is limiting fried foods. States his trouble food is potato chips. For exercise, patient endorses walking sometimes. Denies routine exercise regimen.  Health Maintenance: Immunizations -- patient endorses up-to-date. Will get records for [facility]. Colonoscopy -- Has never had. Denies family history of CRC. Denies blood in the stool, changes to stool, unexplainable weight loss or fever.  Past Medical History  Diagnosis Date  . Hypertension   . Hyperlipidemia   . Seasonal allergies   . History of chicken pox   . Lumbago     Past Surgical History  Procedure Laterality Date  . No past surgeries      No current outpatient prescriptions on file prior to visit.   No current facility-administered medications on file prior to visit.    No Known Allergies  Family History  Problem Relation Age of Onset  . Diabetes Mother     Living  . Hypertension Father   . Diabetes Father     Deceased  . Lung cancer Father 32  . Diabetes Maternal Grandmother     Deceased  . Hypertension Maternal Grandfather   . Stroke Maternal Grandfather     Deceased  . Diabetes Other     Maternal Aunts & Uncles [8]  . Hypertension Other     Paternal Aunts & Uncles  . Diabetes Sister     #1-Deceased  . Hypertension Sister     #2  .  Healthy Daughter     x1    Social History   Social History  . Marital Status: Married    Spouse Name: N/A  . Number of Children: N/A  . Years of Education: N/A   Occupational History  . Not on file.   Social History Main Topics  . Smoking status: Never Smoker   . Smokeless tobacco: Never Used  . Alcohol Use: No  . Drug Use: No  . Sexual Activity: Not on file   Other Topics Concern  . Not on file   Social History Narrative   Review of Systems  Constitutional: Negative for fever and malaise/fatigue.  Eyes: Negative for blurred vision, double vision, photophobia, pain, discharge and redness.  Respiratory: Negative for cough and sputum production.   Cardiovascular: Negative for chest pain and palpitations.  Gastrointestinal: Negative for heartburn, nausea, vomiting, abdominal pain, diarrhea, constipation, blood in stool and melena.  Neurological: Negative for dizziness, loss of consciousness and headaches.   BP 130/84 mmHg  Pulse 86  Temp(Src) 98.3 F (36.8 C) (Oral)  Resp 16  Ht 5\' 10"  (1.778 m)  Wt 234 lb 6 oz (106.312 kg)  BMI 33.63 kg/m2  SpO2 100%  Physical Exam  Constitutional: He is oriented to person, place, and time and well-developed, well-nourished, and in no distress.  HENT:  Head: Normocephalic and atraumatic.  Eyes: Conjunctivae are normal.  Neck: Neck supple.  Cardiovascular: Normal rate, regular rhythm, normal heart sounds and  intact distal pulses.   Pulmonary/Chest: Effort normal and breath sounds normal. No respiratory distress. He has no wheezes. He has no rales. He exhibits no tenderness.  Neurological: He is alert and oriented to person, place, and time.  Skin: Skin is warm and dry. No rash noted.  Psychiatric: Affect normal.  Vitals reviewed.  Assessment/Plan: Colon cancer screening Referral to Gastroenterology placed for screening colonoscopy. Average risk. Asymptomatic.  Adult BMI > 30 Body mass index is 33.63 kg/(m^2). Meal planning  guide given. Recommend 150 minutes aerobic exercise per week.  Hyperlipidemia Continue Simvastatin. Diet and exercise reviewed. Will obtain records for old PCP to review recent labs.  Essential hypertension, benign Controlled. Asymptomatic. Continue current medications.

## 2015-12-30 ENCOUNTER — Encounter: Payer: Self-pay | Admitting: Internal Medicine

## 2016-05-13 ENCOUNTER — Other Ambulatory Visit: Payer: Self-pay | Admitting: Physician Assistant

## 2016-05-31 ENCOUNTER — Telehealth: Payer: Self-pay | Admitting: Physician Assistant

## 2016-05-31 NOTE — Telephone Encounter (Signed)
Ok with me 

## 2016-05-31 NOTE — Telephone Encounter (Signed)
Pt would like to switch providers from [place] to [place] due to pcp leaving. Is this switch okay?    If switch is okay I will call pt back to schedule to come in on 11/17 as requested.

## 2016-06-01 ENCOUNTER — Ambulatory Visit: Payer: 59 | Admitting: Physician Assistant

## 2016-06-01 ENCOUNTER — Other Ambulatory Visit: Payer: Self-pay | Admitting: Physician Assistant

## 2016-06-01 NOTE — Telephone Encounter (Signed)
Medication filled to pharmacy as requested.   

## 2016-06-12 NOTE — Telephone Encounter (Signed)
Patient is following up on this. Please advise °

## 2016-06-13 NOTE — Telephone Encounter (Signed)
OK 

## 2016-06-15 NOTE — Telephone Encounter (Signed)
LVM informing patient that a transfer to Dr. Nani Ravens for PCP was approved.

## 2016-07-05 ENCOUNTER — Telehealth: Payer: Self-pay | Admitting: Behavioral Health

## 2016-07-05 NOTE — Telephone Encounter (Signed)
Unable to reach patient at time of Pre-Visit Call.  Left message for patient to return call when available.    

## 2016-07-06 ENCOUNTER — Ambulatory Visit (INDEPENDENT_AMBULATORY_CARE_PROVIDER_SITE_OTHER): Payer: 59 | Admitting: Family Medicine

## 2016-07-06 ENCOUNTER — Encounter: Payer: Self-pay | Admitting: Family Medicine

## 2016-07-06 VITALS — BP 146/88 | HR 104 | Temp 98.1°F | Ht 70.0 in | Wt 228.2 lb

## 2016-07-06 DIAGNOSIS — E785 Hyperlipidemia, unspecified: Secondary | ICD-10-CM

## 2016-07-06 DIAGNOSIS — Z114 Encounter for screening for human immunodeficiency virus [HIV]: Secondary | ICD-10-CM

## 2016-07-06 DIAGNOSIS — I10 Essential (primary) hypertension: Secondary | ICD-10-CM | POA: Diagnosis not present

## 2016-07-06 DIAGNOSIS — J40 Bronchitis, not specified as acute or chronic: Secondary | ICD-10-CM

## 2016-07-06 LAB — COMPREHENSIVE METABOLIC PANEL
ALBUMIN: 4.8 g/dL (ref 3.5–5.2)
ALT: 21 U/L (ref 0–53)
AST: 28 U/L (ref 0–37)
Alkaline Phosphatase: 66 U/L (ref 39–117)
BUN: 12 mg/dL (ref 6–23)
CALCIUM: 10.1 mg/dL (ref 8.4–10.5)
CHLORIDE: 102 meq/L (ref 96–112)
CO2: 31 meq/L (ref 19–32)
CREATININE: 1.2 mg/dL (ref 0.40–1.50)
GFR: 81.37 mL/min (ref >60.00)
Glucose, Bld: 95 mg/dL (ref 70–99)
POTASSIUM: 3.8 meq/L (ref 3.5–5.1)
Sodium: 140 mEq/L (ref 135–145)
Total Bilirubin: 1.8 mg/dL — ABNORMAL HIGH (ref 0.2–1.2)
Total Protein: 7.7 g/dL (ref 6.0–8.3)

## 2016-07-06 LAB — LIPID PANEL
CHOL/HDL RATIO: 4
CHOLESTEROL: 169 mg/dL (ref 0–200)
HDL: 40.1 mg/dL (ref >39.00)
LDL CALC: 111 mg/dL — AB (ref 0–99)
NonHDL: 129.09
TRIGLYCERIDES: 91 mg/dL (ref 0.0–149.0)
VLDL: 18.2 mg/dL (ref 0.0–40.0)

## 2016-07-06 LAB — MICROALBUMIN / CREATININE URINE RATIO
Creatinine,U: 195.1 mg/dL
Microalb Creat Ratio: 0.4 mg/g (ref 0.0–30.0)
Microalb, Ur: 0.7 mg/dL (ref 0.0–1.9)

## 2016-07-06 MED ORDER — ALBUTEROL SULFATE HFA 108 (90 BASE) MCG/ACT IN AERS: 2.0000 | INHALATION_SPRAY | Freq: Four times a day (QID) | RESPIRATORY_TRACT | 0 refills | Status: DC | PRN

## 2016-07-06 MED ORDER — PREDNISONE 50 MG PO TABS: ORAL_TABLET | ORAL | 0 refills | Status: DC

## 2016-07-06 MED ORDER — SIMVASTATIN 20 MG PO TABS: 20.0000 mg | ORAL_TABLET | Freq: Every day | ORAL | 1 refills | Status: DC

## 2016-07-06 NOTE — Progress Notes (Signed)
Pre visit review using our clinic review tool, if applicable. No additional management support is needed unless otherwise documented below in the visit note. 

## 2016-07-06 NOTE — Patient Instructions (Addendum)
Eat healthy and exercise often.  Claritin (loratadine), Allegra (fexofenadine), Zyrtec (cetirizine); these are listed in order from weakest to strongest. Generic, and therefore cheaper, options are in the parentheses.   Flonase (fluticasone); nasal spray that is over the counter. 2 sprays each nostril, once daily. Aim towards the same side eye when you spray.  There are available OTC, and the generic versions, which may be cheaper, are in parentheses. Show this to a pharmacist if you have trouble finding any of these items.

## 2016-07-06 NOTE — Progress Notes (Signed)
Chief Complaint  Patient presents with  . Transitions Of Care    Pt reports congestion in throat and chest area with SOB yesterday    David [name] here for URI complaints.  Duration: 1 week Associated symptoms: sinus congestion, rhinorrhea, wheezing and shortness of breath Denies: subjective fever, sinus pain, itchy watery eyes, ear pain, ear drainage, sore throat and myalgia Treatment to date: Qvar, Coricidin  Sick contacts: No  ROS:  Const: Denies fevers HEENT: As noted in HPI Lungs: +SOB  Past Medical History:  Diagnosis Date  . History of chicken pox   . Hyperlipidemia   . Hypertension   . Lumbago   . Seasonal allergies    Family History  Problem Relation Age of Onset  . Diabetes Mother     Living  . Hypertension Father   . Diabetes Father     Deceased  . Lung cancer Father 74  . Diabetes Maternal Grandmother     Deceased  . Hypertension Maternal Grandfather   . Stroke Maternal Grandfather     Deceased  . Diabetes Other     Maternal Aunts & Uncles [8]  . Hypertension Other     Paternal Aunts & Uncles  . Diabetes Sister     #1-Deceased  . Hypertension Sister     #2  . Healthy Daughter     x1    BP (!) 146/88 (BP Location: Left Arm, Patient Position: Sitting, Cuff Size: Large)   Pulse (!) 104   Temp 98.1 F (36.7 C) (Oral)   Ht 5\' 10"  (1.778 m)   Wt 228 lb 3.2 oz (103.5 kg)   SpO2 94%   BMI 32.74 kg/m  General: Awake, alert, appears stated age HEENT: AT, [place], ears patent b/l and TM's neg, nares patent w/o discharge, pharynx pink and without exudates, MMM Neck: No masses or asymmetry Heart: RRR, no murmurs, no bruits Lungs: Expiratory wheezes heard diffusely, no accessory muscle use Psych: Age appropriate judgment and insight, normal mood and affect  Wheezy bronchitis - Plan: predniSONE (DELTASONE) 50 MG tablet, albuterol (PROVENTIL HFA;VENTOLIN HFA) 108 (90 Base) MCG/ACT inhaler  Essential hypertension, benign - Plan: Comprehensive metabolic  panel, Microalbumin / creatinine urine ratio  Hyperlipidemia, unspecified hyperlipidemia type - Plan: Lipid panel  Encounter for screening for HIV - Plan: HIV antibody  Orders as above. PO antihistamine and Flonase. Counseled on diet and exercise. Keep BP log at home. Bring to next appt. May have to adjust meds. F/u in 1 week if symptoms worsen or fail to improve. Pt voiced understanding and agreement to the plan.  [place], DO 07/06/16 9:52 AM

## 2016-07-07 LAB — HIV ANTIBODY (ROUTINE TESTING W REFLEX): HIV: NONREACTIVE

## 2016-07-20 ENCOUNTER — Ambulatory Visit (INDEPENDENT_AMBULATORY_CARE_PROVIDER_SITE_OTHER): Payer: 59 | Admitting: Family Medicine

## 2016-07-20 ENCOUNTER — Encounter: Payer: Self-pay | Admitting: Family Medicine

## 2016-07-20 VITALS — BP 138/90 | HR 85 | Temp 98.1°F | Ht 70.0 in | Wt 229.6 lb

## 2016-07-20 DIAGNOSIS — I10 Essential (primary) hypertension: Secondary | ICD-10-CM

## 2016-07-20 DIAGNOSIS — J9801 Acute bronchospasm: Secondary | ICD-10-CM

## 2016-07-20 DIAGNOSIS — Z9189 Other specified personal risk factors, not elsewhere classified: Secondary | ICD-10-CM | POA: Diagnosis not present

## 2016-07-20 MED ORDER — LOSARTAN POTASSIUM-HCTZ 100-12.5 MG PO TABS: 1.0000 | ORAL_TABLET | Freq: Every day | ORAL | 1 refills | Status: DC

## 2016-07-20 MED ORDER — BECLOMETHASONE DIPROPIONATE 40 MCG/ACT IN AERS: 2.0000 | INHALATION_SPRAY | Freq: Every day | RESPIRATORY_TRACT | 0 refills | Status: DC | PRN

## 2016-07-20 NOTE — Patient Instructions (Addendum)
Start taking a baby aspirin daily. Sign up for MyChart to see your labs.  Schedule an appointment in the next 1-2 weeks if your breathing is not improved.

## 2016-07-20 NOTE — Progress Notes (Signed)
Pre visit review using our clinic review tool, if applicable. No additional management support is needed unless otherwise documented below in the visit note. 

## 2016-07-20 NOTE — Progress Notes (Signed)
Chief Complaint  Patient presents with  . Follow-up    BP and review labs     Subjective David [name] is a 55 y.o. male who presents for hypertension follow up. He checks BP at work Blood pressures ranging from 130's/90's on average. He is compliant with medications- Hyzaar 50-12.5 mg daily. Patient has these side effects of medication: none He is adhering to a low sodium and low fat diet. Current exercise: inconsistent walking   Since I saw him last, he was given an albuterol inhaler which helped immensely in addition to a steroid burst. He believes the steroids did help, however is no longer taking them. He states he is continuing to wheeze. He has never had any lung tests or a formal diagnosis of asthma/COPD. He has not been using the Qvar since I saw him.  We also reviewed his labs. His 10 years CVD is 13.3%. He is not taking a baby aspirin daily, however he is taking Zocor daily.   Past Medical History:  Diagnosis Date  . History of chicken pox   . Hyperlipidemia   . Hypertension   . Lumbago   . Seasonal allergies    Family History  Problem Relation Age of Onset  . Diabetes Mother     Living  . Hypertension Father   . Diabetes Father     Deceased  . Lung cancer Father 63  . Diabetes Maternal Grandmother     Deceased  . Hypertension Maternal Grandfather   . Stroke Maternal Grandfather     Deceased  . Diabetes Other     Maternal Aunts & Uncles [8]  . Hypertension Other     Paternal Aunts & Uncles  . Diabetes Sister     #1-Deceased  . Hypertension Sister     #2  . Healthy Daughter     x1     Medications Current Outpatient Prescriptions on File Prior to Visit  Medication Sig Dispense Refill  . albuterol (PROVENTIL HFA;VENTOLIN HFA) 108 (90 Base) MCG/ACT inhaler Inhale 2 puffs into the lungs every 6 (six) hours as needed for wheezing or shortness of breath. 1 Inhaler 0  . losartan-hydrochlorothiazide (HYZAAR) 50-12.5 MG tablet TAKE 1 TABLET BY MOUTH EVERY  MORNING 90 tablet 0  . QVAR 40 MCG/ACT inhaler INHALE 2 PUFFS INTO THE LUNGS DAILY AS NEEDED 8.7 g 0  . simvastatin (ZOCOR) 20 MG tablet Take 1 tablet (20 mg total) by mouth daily. 90 tablet 1   Allergies No Known Allergies  Review of Systems Cardiovascular: no chest pain Respiratory:  +shortness of breath  Exam BP 138/90 (BP Location: Left Arm, Patient Position: Sitting, Cuff Size: Small)   Pulse 85   Temp 98.1 F (36.7 C) (Oral)   Ht 5\' 10"  (1.778 m)   Wt 229 lb 9.6 oz (104.1 kg)   SpO2 96%   BMI 32.94 kg/m  General:  well developed, well nourished, in no apparent distress Skin:  warm, no pallor or diaphoresis Eyes:  pupils equal and round, sclera anicteric without injection Neck: neck supple without adenopathy, thyromegaly, masses Heart :RRR, no murmurs, no bruits, no LE edema Lungs:  No accessory muscle use, +mild end expiratory wheezes Psych: well oriented with normal range of affect and appropriate judgment/insight  Essential hypertension, benign - Plan: losartan-hydrochlorothiazide (HYZAAR) 100-12.5 MG tablet  Bronchospasm - Plan: beclomethasone (QVAR) 40 MCG/ACT inhaler  Orders as above. We'll increase 50-12.5 mg dose to 100-12.5 mg dose. Counseled on diet and exercise. Regarding his breathing, will  do a refill on the Qvar. If he is not improving after 1-2 weeks, I would like to see him again. At that time, will send him for pulmonary function testing with methacholine challenge. As he did do well with albuterol, would try Symbicort, with the higher efficacy LABA. Start baby ASA daily. 10 yr CVD is 13.3%. F/u in 1 mo. The patient voiced understanding and agreement to the plan.  [place], DO 07/20/16  10:40 AM

## 2016-07-24 ENCOUNTER — Other Ambulatory Visit: Payer: Self-pay | Admitting: Family Medicine

## 2016-07-24 DIAGNOSIS — J40 Bronchitis, not specified as acute or chronic: Secondary | ICD-10-CM

## 2016-08-02 ENCOUNTER — Ambulatory Visit (HOSPITAL_BASED_OUTPATIENT_CLINIC_OR_DEPARTMENT_OTHER)
Admission: RE | Admit: 2016-08-02 | Discharge: 2016-08-02 | Disposition: A | Discharge status: Home / Self Care | Payer: 59 | Source: Ambulatory Visit | Attending: Family Medicine | Admitting: Family Medicine

## 2016-08-02 ENCOUNTER — Ambulatory Visit (INDEPENDENT_AMBULATORY_CARE_PROVIDER_SITE_OTHER): Payer: 59 | Admitting: Family Medicine

## 2016-08-02 ENCOUNTER — Encounter: Payer: Self-pay | Admitting: Family Medicine

## 2016-08-02 VITALS — BP 124/80 | HR 95 | Temp 98.1°F | Ht 70.0 in | Wt 231.0 lb

## 2016-08-02 DIAGNOSIS — R0602 Shortness of breath: Secondary | ICD-10-CM | POA: Diagnosis not present

## 2016-08-02 DIAGNOSIS — J40 Bronchitis, not specified as acute or chronic: Secondary | ICD-10-CM

## 2016-08-02 DIAGNOSIS — R05 Cough: Secondary | ICD-10-CM | POA: Diagnosis not present

## 2016-08-02 MED ORDER — PREDNISONE 50 MG PO TABS: ORAL_TABLET | ORAL | 0 refills | Status: DC

## 2016-08-02 MED ORDER — BUDESONIDE-FORMOTEROL FUMARATE 80-4.5 MCG/ACT IN AERO: 2.0000 | INHALATION_SPRAY | Freq: Two times a day (BID) | RESPIRATORY_TRACT | 3 refills | Status: DC

## 2016-08-02 NOTE — Progress Notes (Signed)
Chief Complaint  Patient presents with  . Cough    Pt reports chest congestion and SOB/ Inhaler has helped but x3 days symtpoms as start to come back     Subjective: Patient is a 56 y.o. male here for f/u SOB.  Pt first presented with URI symptoms on 12/8.He was treated with steroids and an albuterol inhaler for presumed diagnosis of wheezy bronchitis. He returned to [place] later stated the steroids and albuterol inhaler helped, however he was still having symptoms. He was in place on a daily inhaled corticosteroid and recommended to follow-up in one month. He is back early because he's been having uses albuterol inhaler daily. More so over the past 3 days. He feels that he has has an upper respiratory infection. He denies any history of asthma, COPD, smoking, or secondhand smoke exposure. He's never had a breathing test before. Currently, he has an experiencing intermittent nasal congestion, cough, and shortness of breath.  ROS: Const: Denies fevers Lungs: +SOB and cough  Family History  Problem Relation Age of Onset  . Diabetes Mother     Living  . Hypertension Father   . Diabetes Father     Deceased  . Lung cancer Father 109  . Diabetes Maternal Grandmother     Deceased  . Hypertension Maternal Grandfather   . Stroke Maternal Grandfather     Deceased  . Diabetes Other     Maternal Aunts & Uncles [8]  . Hypertension Other     Paternal Aunts & Uncles  . Diabetes Sister     #1-Deceased  . Hypertension Sister     #2  . Healthy Daughter     x1   Past Medical History:  Diagnosis Date  . History of chicken pox   . Hyperlipidemia   . Hypertension   . Lumbago   . Seasonal allergies    No Known Allergies  Current Outpatient Prescriptions:  .  beclomethasone (QVAR) 40 MCG/ACT inhaler, Inhale 2 puffs into the lungs daily as needed., Disp: 8.7 g, Rfl: 0 .  losartan-hydrochlorothiazide (HYZAAR) 100-12.5 MG tablet, Take 1 tablet by mouth daily., Disp: 30 tablet, Rfl: 1 .   PROVENTIL HFA 108 (90 Base) MCG/ACT inhaler, INHALE 2 PUFFS INTO THE LUNGS EVERY 6 HOURS AS NEEDED FOR WHEEZING OR SHORTNESS OF BREATH, Disp: 6.7 g, Rfl: 0 .  simvastatin (ZOCOR) 20 MG tablet, Take 1 tablet (20 mg total) by mouth daily., Disp: 90 tablet, Rfl: 1 .  budesonide-formoterol (SYMBICORT) 80-4.5 MCG/ACT inhaler, Inhale 2 puffs into the lungs 2 (two) times daily., Disp: 1 Inhaler, Rfl: 3 .  predniSONE (DELTASONE) 50 MG tablet, Take 1 tab daily for 7 days., Disp: 7 tablet, Rfl: 0  Objective: BP 124/80 (BP Location: Left Arm, Patient Position: Sitting, Cuff Size: Large)   Pulse 95   Temp 98.1 F (36.7 C) (Oral)   Ht 5\' 10"  (1.778 m)   Wt 231 lb (104.8 kg)   SpO2 94%   BMI 33.15 kg/m  General: Awake, appears stated age HEENT: MMM, EOMi, ears patent, TM's neg, no sinus tenderness, nares patent, no rhinorrhea/discharge Heart: RRR, no murmurs, no bruits Lungs: Diffuse wheezing. No accessory muscle use Psych: Age appropriate judgment and insight, normal affect and mood  Assessment and Plan: Wheezy bronchitis - Plan: budesonide-formoterol (SYMBICORT) 80-4.5 MCG/ACT inhaler, predniSONE (DELTASONE) 50 MG tablet, Pulmonary function test  Shortness of breath - Plan: DG Chest 2 View  Orders as above.  I will add a LABA to his ICS regimen  to help with breathing. Steroid burst to help with acute illness.  CXR to r/o infiltrate, or other- I do not appreciate any abnormal findings. I would like him to have PFT's. This result will affect our treatment. Stop using Qvar for now. F/u in 6 weeks to review PFT results. The patient voiced understanding and agreement to the plan.  [place], DO 08/02/16  2:14 PM

## 2016-08-02 NOTE — Progress Notes (Signed)
Pre visit review using our clinic review tool, if applicable. No additional management support is needed unless otherwise documented below in the visit note. 

## 2016-08-02 NOTE — Patient Instructions (Signed)
Do not take the Qvar routinely anymore. The new inhaler is replacing it. Hold onto it for the future though.   Keep using Proventil as your rescue inhaler as needed.

## 2016-08-04 ENCOUNTER — Encounter (HOSPITAL_COMMUNITY): Payer: Self-pay

## 2016-08-04 ENCOUNTER — Emergency Department (HOSPITAL_COMMUNITY)
Admission: EM | Admit: 2016-08-04 | Discharge: 2016-08-04 | Disposition: A | Discharge status: Home / Self Care | Payer: 59 | Attending: Emergency Medicine | Admitting: Emergency Medicine

## 2016-08-04 DIAGNOSIS — R55 Syncope and collapse: Secondary | ICD-10-CM | POA: Diagnosis not present

## 2016-08-04 DIAGNOSIS — I10 Essential (primary) hypertension: Secondary | ICD-10-CM | POA: Insufficient documentation

## 2016-08-04 DIAGNOSIS — R404 Transient alteration of awareness: Secondary | ICD-10-CM | POA: Diagnosis not present

## 2016-08-04 DIAGNOSIS — E876 Hypokalemia: Secondary | ICD-10-CM | POA: Diagnosis not present

## 2016-08-04 DIAGNOSIS — R42 Dizziness and giddiness: Secondary | ICD-10-CM | POA: Diagnosis not present

## 2016-08-04 DIAGNOSIS — Z7982 Long term (current) use of aspirin: Secondary | ICD-10-CM | POA: Insufficient documentation

## 2016-08-04 LAB — CBC WITH DIFFERENTIAL/PLATELET
BASOS ABS: 0 10*3/uL (ref 0.0–0.1)
Basophils Relative: 0 %
EOS ABS: 0.1 10*3/uL (ref 0.0–0.7)
EOS PCT: 1 %
HCT: 50.7 % (ref 39.0–52.0)
Hemoglobin: 17.3 g/dL — ABNORMAL HIGH (ref 13.0–17.0)
LYMPHS ABS: 2.2 10*3/uL (ref 0.7–4.0)
LYMPHS PCT: 17 %
MCH: 27.4 pg (ref 26.0–34.0)
MCHC: 34.1 g/dL (ref 30.0–36.0)
MCV: 80.2 fL (ref 78.0–100.0)
MONO ABS: 0.8 10*3/uL (ref 0.1–1.0)
Monocytes Relative: 6 %
Neutro Abs: 9.7 10*3/uL — ABNORMAL HIGH (ref 1.7–7.7)
Neutrophils Relative %: 76 %
PLATELETS: 290 10*3/uL (ref 150–400)
RBC: 6.32 MIL/uL — AB (ref 4.22–5.81)
RDW: 13.6 % (ref 11.5–15.5)
WBC: 12.8 10*3/uL — AB (ref 4.0–10.5)

## 2016-08-04 LAB — BASIC METABOLIC PANEL
Anion gap: 12 (ref 5–15)
BUN: 17 mg/dL (ref 6–20)
CHLORIDE: 102 mmol/L (ref 101–111)
CO2: 23 mmol/L (ref 22–32)
CREATININE: 1.51 mg/dL — AB (ref 0.61–1.24)
Calcium: 8.9 mg/dL (ref 8.9–10.3)
GFR calc non Af Amer: 51 mL/min — ABNORMAL LOW (ref >60)
GFR, EST AFRICAN AMERICAN: 59 mL/min — AB (ref >60)
Glucose, Bld: 135 mg/dL — ABNORMAL HIGH (ref 65–99)
POTASSIUM: 3.1 mmol/L — AB (ref 3.5–5.1)
SODIUM: 137 mmol/L (ref 135–145)

## 2016-08-04 LAB — CBG MONITORING, ED: GLUCOSE-CAPILLARY: 150 mg/dL — AB (ref 65–99)

## 2016-08-04 MED ORDER — POTASSIUM CHLORIDE 20 MEQ PO PACK
40.0000 meq | PACK | Freq: Once | ORAL | Status: AC
  Administered 2016-08-04: 40 meq via ORAL
  Filled 2016-08-04: qty 2

## 2016-08-04 MED ORDER — SODIUM CHLORIDE 0.9 % IV BOLUS (SEPSIS)
1000.0000 mL | Freq: Once | INTRAVENOUS | Status: AC
  Administered 2016-08-04: 1000 mL via INTRAVENOUS

## 2016-08-04 NOTE — ED Notes (Signed)
NAD. Pt ambulatory at DC verbalizing understanding of need to follow up with PCP and to return to ED if any more episodes.

## 2016-08-04 NOTE — ED Provider Notes (Signed)
[facility] DEPT Provider Note   CSN: [id number] Arrival date & time: 08/04/16  1516     History   Chief Complaint Chief Complaint  Patient presents with  . Loss of Consciousness    HPI David [name] is a 56 y.o. male.  The history is provided by the patient and medical records.   56 year old male with a past mental history of hypertension, hyperlipidemia who presents today with syncope that occurred immediately prior to arrival. Patient states he had been sitting down for several minutes eating at church. States he began to have a hot feeling and also felt queasy. Seen after that his wife states that he slumped over in his eyes rolled back the back of his head. He is making snoring sound as well during this. His wife states this lasted approximately 30-45 seconds. There was no seizure-like activity or loss of bowel or bladder control during this episode. He denies any similar symptoms. He did not have any chest pain or shortness of breath associated with this. Patient is notably on day 3 of a prednisone course for presumed upper respiratory symptoms. Patient states he's been doing well with this and feels like his upper respiratory symptoms are getting better. Denies any prior history of heart problems. Is not a smoker. He is on losartan and states that his doctor had recently increased his dose approximately a month ago. Abdomen doing well with this. States he took his medications this morning. States he nearly feels back to baseline although when he stood up he did feel slightly off balance.  Past Medical History:  Diagnosis Date  . History of chicken pox   . Hyperlipidemia   . Hypertension   . Lumbago   . Seasonal allergies     Patient Active Problem List   Diagnosis Date Noted  . Essential hypertension, benign 11/28/2015  . Colon cancer screening 11/28/2015  . Adult BMI > 30 11/28/2015  . Hyperlipidemia 11/28/2015    Past Surgical History:  Procedure Laterality Date    . NO PAST SURGERIES         Home Medications    Prior to Admission medications   Medication Sig Start Date End Date Taking? Authorizing Provider  aspirin 81 MG chewable tablet Chew 81 mg by mouth daily.   Yes Historical Provider, MD  budesonide-formoterol (SYMBICORT) 80-4.5 MCG/ACT inhaler Inhale 2 puffs into the lungs 2 (two) times daily. 08/02/16  Yes Crosby Oyster Wendling, DO  DM-APAP-CPM (CORICIDIN HBP FLU) 15-500-2 MG TABS Take 1-2 tablets by mouth every 6 (six) hours as needed (for symptoms).    Yes Historical Provider, MD  losartan-hydrochlorothiazide (HYZAAR) 100-12.5 MG tablet Take 1 tablet by mouth daily. 07/20/16  Yes Crosby Oyster Wendling, DO  predniSONE (DELTASONE) 50 MG tablet Take 1 tab daily for 7 days. 08/02/16  Yes Crosby Oyster Wendling, DO  PROVENTIL HFA 108 [phone] Base) MCG/ACT inhaler INHALE 2 PUFFS INTO THE LUNGS EVERY 6 HOURS AS NEEDED FOR WHEEZING OR SHORTNESS OF BREATH 07/24/16  Yes Crosby Oyster Wendling, DO  simvastatin (ZOCOR) 20 MG tablet Take 1 tablet (20 mg total) by mouth daily. 07/06/16  Yes Crosby Oyster Wendling, DO  beclomethasone (QVAR) 40 MCG/ACT inhaler Inhale 2 puffs into the lungs daily as needed. Patient not taking: Reported on 08/04/2016 07/20/16   Shelda Pal, DO    Family History Family History  Problem Relation Age of Onset  . Diabetes Mother     Living  . Hypertension Father   . Diabetes Father  Deceased  . Lung cancer Father 62  . Diabetes Maternal Grandmother     Deceased  . Hypertension Maternal Grandfather   . Stroke Maternal Grandfather     Deceased  . Diabetes Other     Maternal Aunts & Uncles [8]  . Hypertension Other     Paternal Aunts & Uncles  . Diabetes Sister     #1-Deceased  . Hypertension Sister     #2  . Healthy Daughter     x1    Social History Social History  Substance Use Topics  . Smoking status: Never Smoker  . Smokeless tobacco: Never Used  . Alcohol use No     Allergies   Patient has  no known allergies.   Review of Systems Review of Systems  Constitutional: Negative for chills and fever.  HENT: Negative for ear pain and sore throat.   Eyes: Negative for pain and visual disturbance.  Respiratory: Positive for cough (recently, improving). Negative for shortness of breath.   Cardiovascular: Negative for chest pain and palpitations.  Gastrointestinal: Positive for nausea (prior to symptoms starting). Negative for abdominal pain and vomiting.  Genitourinary: Negative for dysuria and hematuria.  Musculoskeletal: Negative for arthralgias and back pain.  Skin: Negative for color change and rash.  Neurological: Positive for syncope (as described in HPI). Negative for seizures, weakness, numbness and headaches.  Psychiatric/Behavioral: Negative for agitation and confusion.  All other systems reviewed and are negative.    Physical Exam Updated Vital Signs BP 128/86   Pulse 88   Temp 97.4 F (36.3 C) (Oral)   Resp 20   Ht 5\' 10"  (1.778 m)   Wt 104.8 kg   SpO2 96%   BMI 33.15 kg/m   Physical Exam  Constitutional: He is oriented to person, place, and time. He appears well-developed and well-nourished.  HENT:  Head: Normocephalic and atraumatic.  Eyes: Conjunctivae and EOM are normal. Pupils are equal, round, and reactive to light.  Neck: Normal range of motion. Neck supple.  Cardiovascular: Normal rate and regular rhythm.   No murmur heard. Pulmonary/Chest: Effort normal and breath sounds normal. No respiratory distress.  Abdominal: Soft. He exhibits no distension. There is no tenderness.  Musculoskeletal: Normal range of motion. He exhibits no edema.  Neurological: He is alert and oriented to person, place, and time. He has normal strength. No cranial nerve deficit (CN II-XII intact) or sensory deficit. Coordination (finger to nose nml bilaterally) normal. GCS eye subscore is 4. GCS verbal subscore is 5. GCS motor subscore is 6.  Skin: Skin is warm and dry.    Psychiatric: He has a normal mood and affect.  Nursing note and vitals reviewed.    ED Treatments / Results  Labs (all labs ordered are listed, but only abnormal results are displayed) Labs Reviewed  CBC WITH DIFFERENTIAL/PLATELET - Abnormal; Notable for the following:       Result Value   WBC 12.8 (*)    RBC 6.32 (*)    Hemoglobin 17.3 (*)    Neutro Abs 9.7 (*)    All other components within normal limits  BASIC METABOLIC PANEL - Abnormal; Notable for the following:    Potassium 3.1 (*)    Glucose, Bld 135 (*)    Creatinine, Ser 1.51 (*)    GFR calc non Af Amer 51 (*)    GFR calc Af Amer 59 (*)    All other components within normal limits  CBG MONITORING, ED - Abnormal; Notable for the  following:    Glucose-Capillary 150 (*)    All other components within normal limits    EKG  EKG Interpretation  Date/Time:  Saturday August 04 2016 15:16:57 EST Ventricular Rate:  100 PR Interval:    QRS Duration: 95 QT Interval:  365 QTC Calculation: 437 R Axis:   72 Text Interpretation:  Sinus rhythm Nonspecific T wave abnormality No previous tracing Confirmed by Ashok Cordia  MD, Lennette Bihari ([id number]) on 08/04/2016 3:19:38 PM Also confirmed by Ashok Cordia  MD, Lennette Bihari ([id number]), editor [place], Leda Gauze [phone])  on 08/04/2016 4:26:47 PM       Radiology No results found.  Procedures Procedures (including critical care time)  Medications Ordered in ED Medications  sodium chloride 0.9 % bolus 1,000 mL (0 mLs Intravenous Stopped 08/04/16 1730)  potassium chloride (KLOR-CON) packet 40 mEq (40 mEq Oral Given 08/04/16 1730)     Initial Impression / Assessment and Plan / ED Course  I have reviewed the triage vital signs and the nursing notes.  Pertinent labs & imaging results that were available during my care of the patient were reviewed by me and considered in my medical decision making (see chart for details).  Clinical Course     56 year old male presents today with syncopal episode that occurred  immediately prior to arrival. On arrival, patient is awake and alert and oriented. His blood pressure is lower than his stated normal. Patient is not orthostatic when we checked. He does feel unsteady when walking though.  Obtain labs which are notable for hemoconcentration with his hemoglobin being 17.3 and hematocrit 50.7. Additionally, patient is mildly hypokalemic and has a mild creatinine bump as well. Feel that all these findings seem consistent with mild dehydration which likely lead to the patient's syncope today. No focal neurological deficits were noted on my exam. Denies chest pain associated with this. No prior history of heart disease or failure.   Patient was given a liter of normal saline. Additionally we challenged him to eat and drink by mouth which she was able to tolerate. After completing the liter, patient felt much better. He was able and really without any difficulty. His potassium was repleted here and I advised him to eat a well-balanced diet home in order to prevent further hypokalemia. Advised return if he has return of symptoms or other worrisome symptoms. Patient is agreeable and discharged home in good condition.  Final Clinical Impressions(s) / ED Diagnoses   Final diagnoses:  Hypokalemia  Syncope, unspecified syncope type    New Prescriptions Discharge Medication List as of 08/04/2016  6:56 PM       Theodosia Quay, MD 08/04/16 [place], MD 08/04/16 2151

## 2016-08-04 NOTE — ED Notes (Signed)
While standing, pt noted to be unsteady and c/o "a little dizziness and weakness". MD made aware.

## 2016-08-04 NOTE — ED Triage Notes (Signed)
Pt BIB [facility] from church where pt experienced syncopal episode while sitting. Per bystanders pt was only responding to strong sternal rub. Pt. Sitting during LOC, no trauma. Denies CP reports SOB and diaphoresis. Per ems 12 Lead showed elevation.

## 2016-08-04 NOTE — ED Notes (Signed)
Pt ambulated to and from bathroom without difficulty or assistance. Normal gait noted. Pt denied any weakness, dizziness, lightheadedness.

## 2016-08-04 NOTE — Discharge Instructions (Signed)
Return to the ER if you develop: chest pain, more episodes of passing out, confusion, weakness, numbness, or other worrisome signs or symptoms.   Your potassium is low, we gave you some today to help replete it. Continue to eat a well balanced diet and try to stay hydrated.

## 2016-08-04 NOTE — ED Notes (Signed)
ED Provider at bedside. 

## 2016-08-06 ENCOUNTER — Telehealth: Payer: Self-pay | Admitting: Family Medicine

## 2016-08-06 NOTE — Telephone Encounter (Signed)
Patient called to inform Dr. Nani Ravens that he was seen in the ED on Saturday for what ended up being dehydration. He states he has been drinking more water. Patient wants to make sure that the medications he was recently prescribed are okay to continue taking or if he should stop? Please advise   Phone: [phone]

## 2016-08-07 MED ORDER — POTASSIUM CHLORIDE ER 10 MEQ PO TBCR: 20.0000 meq | EXTENDED_RELEASE_TABLET | Freq: Two times a day (BID) | ORAL | 0 refills | Status: DC

## 2016-08-07 NOTE — Telephone Encounter (Signed)
Please advise. TL/cMA

## 2016-08-07 NOTE — Telephone Encounter (Signed)
Have him come in tomorrow and we will recheck his labs and discuss alternative BP treatments. I do not want him taking his Hyzaar for the time being. I have also called in a potassium supplement; I do not see that he received one in the ER, but if he did, he does not need to take the one I called in.

## 2016-08-09 NOTE — Telephone Encounter (Signed)
Called and [facility] @ 2:42pm @ [phone]) asking the pt to RTC regarding above message.//AB/CMA

## 2016-08-13 NOTE — Telephone Encounter (Signed)
LVMOM for pt to return call into the office. TL/CMA

## 2016-08-17 ENCOUNTER — Encounter: Payer: Self-pay | Admitting: Family Medicine

## 2016-08-17 ENCOUNTER — Ambulatory Visit (INDEPENDENT_AMBULATORY_CARE_PROVIDER_SITE_OTHER): Payer: 59 | Admitting: Internal Medicine

## 2016-08-17 ENCOUNTER — Ambulatory Visit (INDEPENDENT_AMBULATORY_CARE_PROVIDER_SITE_OTHER): Payer: 59 | Admitting: Family Medicine

## 2016-08-17 VITALS — BP 110/68 | HR 83 | Temp 98.1°F | Ht 70.0 in | Wt 234.4 lb

## 2016-08-17 DIAGNOSIS — J40 Bronchitis, not specified as acute or chronic: Secondary | ICD-10-CM

## 2016-08-17 DIAGNOSIS — Z87898 Personal history of other specified conditions: Secondary | ICD-10-CM | POA: Diagnosis not present

## 2016-08-17 DIAGNOSIS — J449 Chronic obstructive pulmonary disease, unspecified: Secondary | ICD-10-CM | POA: Diagnosis not present

## 2016-08-17 DIAGNOSIS — E876 Hypokalemia: Secondary | ICD-10-CM

## 2016-08-17 DIAGNOSIS — Z131 Encounter for screening for diabetes mellitus: Secondary | ICD-10-CM | POA: Diagnosis not present

## 2016-08-17 LAB — BASIC METABOLIC PANEL
BUN: 11 mg/dL (ref 6–23)
CALCIUM: 9.7 mg/dL (ref 8.4–10.5)
CHLORIDE: 100 meq/L (ref 96–112)
CO2: 30 meq/L (ref 19–32)
Creatinine, Ser: 1.23 mg/dL (ref 0.40–1.50)
GFR: 79.05 mL/min (ref >60.00)
GLUCOSE: 82 mg/dL (ref 70–99)
Potassium: 4.6 mEq/L (ref 3.5–5.1)
SODIUM: 137 meq/L (ref 135–145)

## 2016-08-17 LAB — PULMONARY FUNCTION TEST
DL/VA % PRED: 101 %
DL/VA: 4.79 ml/min/mmHg/L
DLCO COR: 32.02 ml/min/mmHg
DLCO cor % pred: 95 %
DLCO unc % pred: 102 %
DLCO unc: 34.58 ml/min/mmHg
FEF 25-75 Post: 2.4 L/sec
FEF 25-75 Pre: 2 L/sec
FEF2575-%CHANGE-POST: 19 %
FEF2575-%PRED-POST: 72 %
FEF2575-%PRED-PRE: 60 %
FEV1-%CHANGE-POST: 3 %
FEV1-%PRED-PRE: 91 %
FEV1-%Pred-Post: 95 %
FEV1-Post: 3.23 L
FEV1-Pre: 3.11 L
FEV1FVC-%CHANGE-POST: -2 %
FEV1FVC-%Pred-Pre: 88 %
FEV6-%Change-Post: 7 %
FEV6-%PRED-PRE: 104 %
FEV6-%Pred-Post: 112 %
FEV6-PRE: 4.36 L
FEV6-Post: 4.67 L
FEV6FVC-%Change-Post: 0 %
FEV6FVC-%PRED-PRE: 101 %
FEV6FVC-%Pred-Post: 101 %
FVC-%Change-Post: 6 %
FVC-%PRED-POST: 110 %
FVC-%PRED-PRE: 104 %
FVC-POST: 4.74 L
FVC-PRE: 4.47 L
POST FEV1/FVC RATIO: 68 %
Post FEV6/FVC ratio: 98 %
Pre FEV1/FVC ratio: 70 %
Pre FEV6/FVC Ratio: 99 %
RV % PRED: 77 %
RV: 1.67 L
TLC % pred: 90 %
TLC: 6.46 L

## 2016-08-17 LAB — HEMOGLOBIN A1C: Hgb A1c MFr Bld: 6.1 % (ref 4.6–6.5)

## 2016-08-17 NOTE — Progress Notes (Signed)
Pre visit review using our clinic review tool, if applicable. No additional management support is needed unless otherwise documented below in the visit note. 

## 2016-08-17 NOTE — Patient Instructions (Signed)
If you have return of breathing symptoms, start back on the Symbicort and let our office know. Otherwise, go off of your inhalers.

## 2016-08-17 NOTE — Progress Notes (Signed)
Chief Complaint  Patient presents with  . Follow-up    HTN    Subjective David [name] is a 56 y.o. male who presents for PFT f/u.  PFT done today for wheezing, SOB and cough. He is taking Symbicort and feels like it was helping as he did not have return of symptoms after his last steroid burst. PFT done shows FEV1/FVC of 0.7 and 0.68 after bronchodilator. He does not smoke and has never smoked. He does have a hx of second hand smoke exposure when he was younger through his father. Denies any chest pain.  He also had a syncopal episode in early January. He was at a funeral and was sitting down, eating. He felt warm and flushed. After telling his wife, he drank some water. The next thing he knew, he was on the ground and a IT trainer and nurse were asking him questions. He felt fine and was answering questions appropriately. No reports of shaking, low of bladder control or biting his tongue. He did go to ER and labs were largely unremarkable with the exception of hypokalemia. It was attributed to dehydration. He has been increasing his PO intake of fluids since then and notices he is urinating more often. Denies fevers, palpitations, SOB, or any other episodes of syncope/pre-syncope.   Past Medical History:  Diagnosis Date  . History of chicken pox   . Hyperlipidemia   . Hypertension   . Lumbago   . Seasonal allergies    Family History  Problem Relation Age of Onset  . Diabetes Mother     Living  . Hypertension Father   . Diabetes Father     Deceased  . Lung cancer Father 78  . Diabetes Maternal Grandmother     Deceased  . Hypertension Maternal Grandfather   . Stroke Maternal Grandfather     Deceased  . Diabetes Other     Maternal Aunts & Uncles [8]  . Hypertension Other     Paternal Aunts & Uncles  . Diabetes Sister     #1-Deceased  . Hypertension Sister     #2  . Healthy Daughter     x1     Medications Current Outpatient Prescriptions on File Prior to Visit   Medication Sig Dispense Refill  . aspirin 81 MG chewable tablet Chew 81 mg by mouth daily.    . budesonide-formoterol (SYMBICORT) 80-4.5 MCG/ACT inhaler Inhale 2 puffs into the lungs 2 (two) times daily. 1 Inhaler 3  . DM-APAP-CPM (CORICIDIN HBP FLU) 15-500-2 MG TABS Take 1-2 tablets by mouth every 6 (six) hours as needed (for symptoms).     . losartan-hydrochlorothiazide (HYZAAR) 100-12.5 MG tablet Take 1 tablet by mouth daily. 30 tablet 1  . simvastatin (ZOCOR) 20 MG tablet Take 1 tablet (20 mg total) by mouth daily. 90 tablet 1   Allergies No Known Allergies  Review of Systems Cardiovascular: no chest pain Respiratory:  no shortness of breath  Exam BP 110/68 (BP Location: Left Arm, Patient Position: Sitting, Cuff Size: Large)   Pulse 83   Temp 98.1 F (36.7 C) (Oral)   Ht 5\' 10"  (1.778 m)   Wt 234 lb 6.4 oz (106.3 kg)   SpO2 98%   BMI 33.63 kg/m  General:  well developed, well nourished, in no apparent distress Skin:  warm, no pallor or diaphoresis Eyes:  pupils equal and round, sclera anicteric without injection Heart :RRR, no murmurs, no bruits, no LE edema Lungs:  clear to auscultation, no accessory muscle  use Psych: well oriented with normal range of affect and appropriate judgment/insight  Chronic obstructive pulmonary disease, unspecified COPD type ([place])  History of syncope  Hypokalemia - Plan: Basic Metabolic Panel (BMET)  Screening for diabetes mellitus - Plan: Hemoglobin A1c  Orders as above. PFT certainly shows some evidence of obstruction. Stop Symbicort for now. If he starts to worsen, will have him start again and let us know. I will then call in Incruse or equivalent. Does not sound like he had a seizure. Thought it could have been related to wearing a collared shirt that was causing pressure against his neck (carotids) contributing to him passing out. Continue to keep up with PO intake of fluids. Counsled on diet and exercise. F/u in 6 mo. Sooner if he  has issues with breathing or syncope. The patient voiced understanding and agreement to the plan.  [place], DO 08/17/16  3:25 PM

## 2016-08-17 NOTE — Telephone Encounter (Signed)
3rd attempt to contact patient.  LVMOM for return call.  How would you like me to move forward? Please advise. TL/CMA

## 2016-09-13 ENCOUNTER — Ambulatory Visit: Payer: 59 | Admitting: Family Medicine

## 2016-09-14 ENCOUNTER — Other Ambulatory Visit: Payer: Self-pay | Admitting: Family Medicine

## 2016-09-14 DIAGNOSIS — I10 Essential (primary) hypertension: Secondary | ICD-10-CM

## 2016-09-17 NOTE — Telephone Encounter (Signed)
I have refilled Rx. #90 tablets #0 refills. TL.CMA

## 2016-12-06 ENCOUNTER — Other Ambulatory Visit: Payer: Self-pay | Admitting: Family Medicine

## 2016-12-06 DIAGNOSIS — J9801 Acute bronchospasm: Secondary | ICD-10-CM

## 2016-12-07 NOTE — Telephone Encounter (Signed)
Called and [facility] @ 4:49pm @ [phone]) informing the pt that we received a refill request for Qvar inhaler.  Our records show that the Qvar has been discontinued, and I wanted to know if he has been placed back on it.//AB/CMA

## 2016-12-07 NOTE — Telephone Encounter (Signed)
Called and [facility] @ 11:03am @ [phone]) asking the pt to RTC regarding medication refill.//AB/CMA

## 2016-12-07 NOTE — Telephone Encounter (Signed)
Pt returned call

## 2016-12-10 NOTE — Telephone Encounter (Signed)
Rx sent to the pharmacy by e-script.//AB/CMA 

## 2016-12-10 NOTE — Telephone Encounter (Signed)
Patient states he uses Qvar during this time of year because of pollen. States he was told to use it as needed by provider.

## 2016-12-12 ENCOUNTER — Other Ambulatory Visit: Payer: Self-pay | Admitting: *Deleted

## 2016-12-12 MED ORDER — BECLOMETHASONE DIPROP HFA 40 MCG/ACT IN AERB: 2.0000 | INHALATION_SPRAY | Freq: Every day | RESPIRATORY_TRACT | 1 refills | Status: DC | PRN

## 2016-12-12 NOTE — Telephone Encounter (Signed)
Received fax from [facility] stating that the Qvar MDI is discontinued.  Requesting new prescription for Qvar redihaler which replaced the old Qvar inhaler.  Spoke with the pharmacist and gave a verbal order to switch to the Qvar redihaler with same directions.  Pharmacist stated that the Qvar redihaler is covered.//AB/CMA

## 2016-12-17 ENCOUNTER — Other Ambulatory Visit: Payer: Self-pay | Admitting: Family Medicine

## 2016-12-17 DIAGNOSIS — I10 Essential (primary) hypertension: Secondary | ICD-10-CM

## 2016-12-17 NOTE — Telephone Encounter (Signed)
Rx sent to the pharmacy by e-script.//AB/CMA 

## 2017-01-09 ENCOUNTER — Telehealth: Payer: Self-pay | Admitting: Family Medicine

## 2017-01-09 NOTE — Telephone Encounter (Signed)
Caller name: Relation to LS:LHTD Call back [address] and [place]  Reason for call: pt states pharmacy informed him that his rx qvar was discontinued, and a request was sent to Dr. Nani Ravens to provide a alternative rx, pharmacy states the old rx was sent to them again verses the alternative. Pt is needed the rx. Please advise

## 2017-01-09 NOTE — Telephone Encounter (Signed)
Called and spoke with the pt and he stated that the pharmacy does not have the prescription for the [place].  Informed him that I called on (12/12/16) and spoke with pharmacist and give him verbal order for the new QVAR.  He stated that they informed him that I did not have it.  Pt asked if I would call the pharmacy and give them the prescription again.  Called [facility] and spoke with [facility]) and verbally gave him the prescription for the QVAR Redihaler over the phone.//AB/CMA

## 2017-01-16 ENCOUNTER — Other Ambulatory Visit: Payer: Self-pay | Admitting: Family Medicine

## 2017-01-16 DIAGNOSIS — E785 Hyperlipidemia, unspecified: Secondary | ICD-10-CM

## 2017-02-14 ENCOUNTER — Encounter: Payer: 59 | Admitting: Family Medicine

## 2017-03-07 ENCOUNTER — Encounter: Payer: 59 | Admitting: Family Medicine

## 2017-03-07 DIAGNOSIS — Z0289 Encounter for other administrative examinations: Secondary | ICD-10-CM

## 2017-03-18 ENCOUNTER — Other Ambulatory Visit: Payer: Self-pay | Admitting: Family Medicine

## 2017-03-18 DIAGNOSIS — I10 Essential (primary) hypertension: Secondary | ICD-10-CM

## 2017-04-09 ENCOUNTER — Other Ambulatory Visit: Payer: Self-pay | Admitting: Family Medicine

## 2017-04-09 DIAGNOSIS — E785 Hyperlipidemia, unspecified: Secondary | ICD-10-CM

## 2017-06-13 ENCOUNTER — Other Ambulatory Visit: Payer: Self-pay | Admitting: Family Medicine

## 2017-06-13 DIAGNOSIS — I10 Essential (primary) hypertension: Secondary | ICD-10-CM

## 2017-07-09 ENCOUNTER — Other Ambulatory Visit: Payer: Self-pay | Admitting: Family Medicine

## 2017-07-09 DIAGNOSIS — E785 Hyperlipidemia, unspecified: Secondary | ICD-10-CM

## 2017-09-14 ENCOUNTER — Other Ambulatory Visit: Payer: Self-pay | Admitting: Family Medicine

## 2017-09-14 DIAGNOSIS — I10 Essential (primary) hypertension: Secondary | ICD-10-CM

## 2017-09-26 ENCOUNTER — Other Ambulatory Visit: Payer: Self-pay | Admitting: Family Medicine

## 2017-09-26 DIAGNOSIS — J40 Bronchitis, not specified as acute or chronic: Secondary | ICD-10-CM

## 2017-10-09 ENCOUNTER — Other Ambulatory Visit: Payer: Self-pay | Admitting: Family Medicine

## 2017-10-09 DIAGNOSIS — E785 Hyperlipidemia, unspecified: Secondary | ICD-10-CM

## 2017-10-19 ENCOUNTER — Other Ambulatory Visit: Payer: Self-pay | Admitting: Family Medicine

## 2017-10-19 DIAGNOSIS — E785 Hyperlipidemia, unspecified: Secondary | ICD-10-CM

## 2017-10-21 ENCOUNTER — Other Ambulatory Visit: Payer: Self-pay | Admitting: Family Medicine

## 2017-10-21 DIAGNOSIS — E785 Hyperlipidemia, unspecified: Secondary | ICD-10-CM

## 2017-12-06 ENCOUNTER — Ambulatory Visit: Payer: 59 | Admitting: Family Medicine

## 2017-12-06 ENCOUNTER — Encounter: Payer: Self-pay | Admitting: Family Medicine

## 2017-12-06 VITALS — BP 110/82 | HR 73 | Temp 98.4°F | Ht 70.0 in | Wt 241.0 lb

## 2017-12-06 DIAGNOSIS — Z1211 Encounter for screening for malignant neoplasm of colon: Secondary | ICD-10-CM

## 2017-12-06 DIAGNOSIS — I10 Essential (primary) hypertension: Secondary | ICD-10-CM

## 2017-12-06 DIAGNOSIS — Z1159 Encounter for screening for other viral diseases: Secondary | ICD-10-CM | POA: Diagnosis not present

## 2017-12-06 DIAGNOSIS — E785 Hyperlipidemia, unspecified: Secondary | ICD-10-CM | POA: Diagnosis not present

## 2017-12-06 LAB — COMPREHENSIVE METABOLIC PANEL
ALT: 18 U/L (ref 0–53)
AST: 25 U/L (ref 0–37)
Albumin: 4.4 g/dL (ref 3.5–5.2)
Alkaline Phosphatase: 55 U/L (ref 39–117)
BILIRUBIN TOTAL: 1.7 mg/dL — AB (ref 0.2–1.2)
BUN: 14 mg/dL (ref 6–23)
CALCIUM: 9.3 mg/dL (ref 8.4–10.5)
CO2: 29 mEq/L (ref 19–32)
Chloride: 101 mEq/L (ref 96–112)
Creatinine, Ser: 1.24 mg/dL (ref 0.40–1.50)
GFR: 77.93 mL/min (ref >60.00)
Glucose, Bld: 96 mg/dL (ref 70–99)
Potassium: 3.8 mEq/L (ref 3.5–5.1)
Sodium: 138 mEq/L (ref 135–145)
TOTAL PROTEIN: 7.1 g/dL (ref 6.0–8.3)

## 2017-12-06 LAB — LIPID PANEL
Cholesterol: 152 mg/dL (ref 0–200)
HDL: 40.6 mg/dL (ref >39.00)
LDL Cholesterol: 92 mg/dL (ref 0–99)
NonHDL: 111.08
TRIGLYCERIDES: 95 mg/dL (ref 0.0–149.0)
Total CHOL/HDL Ratio: 4
VLDL: 19 mg/dL (ref 0.0–40.0)

## 2017-12-06 MED ORDER — SIMVASTATIN 20 MG PO TABS: ORAL_TABLET | ORAL | 3 refills | Status: DC

## 2017-12-06 MED ORDER — LOSARTAN POTASSIUM-HCTZ 100-12.5 MG PO TABS: 1.0000 | ORAL_TABLET | Freq: Every day | ORAL | 3 refills | Status: DC

## 2017-12-06 NOTE — Progress Notes (Signed)
Pre visit review using our clinic review tool, if applicable. No additional management support is needed unless otherwise documented below in the visit note. 

## 2017-12-06 NOTE — Patient Instructions (Addendum)
Keep the diet clean. Try to stay physically active.  You don't need to check your blood pressures at home unless you want to.   If you do not hear anything about your referral in the next 1-2 weeks, call our office and ask for an update.  Let us know if you need anything.

## 2017-12-06 NOTE — Progress Notes (Signed)
Chief Complaint  Patient presents with  . Follow-up    medication refills    Subjective David David [name] is a 57 y.o. male who presents for hypertension follow up. He does monitor home blood pressures. Blood pressures ranging from 120's/70-80's on average. He is compliant with medications. Patient has these side effects of medication: none He will sometimes adhere to a healthy diet overall. Current exercise: once every week or 2 will go to gym  Hyperlipidemia Patient presents for dyslipidemia follow up. Currently being treated with Zocor  and compliance with treatment thus far has been good. He denies myalgias. He is sometimes adhering to a healthy. The patient exercises rarely.  The patient is not known to have coexisting coronary artery disease.    Past Medical History:  Diagnosis Date  . History of chicken pox   . Hyperlipidemia   . Hypertension   . Lumbago   . Seasonal allergies     Review of Systems Cardiovascular: no chest pain Respiratory:  no shortness of breath  Exam BP 110/82 (BP Location: Left Arm, Patient Position: Sitting, Cuff Size: Large)   Pulse 73   Temp 98.4 F (36.9 C) (Oral)   Ht 5\' 10"  (1.778 m)   Wt 241 lb (109.3 kg)   SpO2 97%   BMI 34.58 kg/m  General:  well developed, well nourished, in no apparent distress Heart: RRR, no bruits, no LE edema Lungs: clear to auscultation, no accessory muscle use Psych: well oriented with normal range of affect and appropriate judgment/insight  Essential hypertension, benign - Plan: losartan-hydrochlorothiazide (HYZAAR) 100-12.5 MG tablet, Comprehensive metabolic panel  Hyperlipidemia, unspecified hyperlipidemia type - Plan: simvastatin (ZOCOR) 20 MG tablet, Lipid panel, Comprehensive metabolic panel  Screen for colon cancer - Plan: Ambulatory referral to Gastroenterology  Encounter for hepatitis C screening test for low risk patient - Plan: Hepatitis C antibody  Orders as above. OK to stop ASA.  Cont  statin and above. Counseled on diet and exercise. F/u in 6 mo for CPE or prn. The patient voiced understanding and agreement to the plan.  [name], DO 12/06/17  10:23 AM

## 2017-12-08 LAB — HEPATITIS C ANTIBODY
HEP C AB: NONREACTIVE
SIGNAL TO CUT-OFF: 0.01 (ref <1.00)

## 2017-12-14 ENCOUNTER — Other Ambulatory Visit: Payer: Self-pay | Admitting: Family Medicine

## 2017-12-14 DIAGNOSIS — J40 Bronchitis, not specified as acute or chronic: Secondary | ICD-10-CM

## 2018-01-06 ENCOUNTER — Encounter: Payer: Self-pay | Admitting: Family Medicine

## 2018-03-03 ENCOUNTER — Other Ambulatory Visit: Payer: Self-pay | Admitting: Family Medicine

## 2018-03-03 DIAGNOSIS — J40 Bronchitis, not specified as acute or chronic: Secondary | ICD-10-CM

## 2018-05-11 ENCOUNTER — Other Ambulatory Visit: Payer: Self-pay | Admitting: Family Medicine

## 2018-05-11 DIAGNOSIS — J40 Bronchitis, not specified as acute or chronic: Secondary | ICD-10-CM

## 2018-06-08 ENCOUNTER — Other Ambulatory Visit: Payer: Self-pay | Admitting: Family Medicine

## 2018-06-08 DIAGNOSIS — J40 Bronchitis, not specified as acute or chronic: Secondary | ICD-10-CM

## 2018-06-23 ENCOUNTER — Encounter: Payer: Self-pay | Admitting: Family Medicine

## 2018-06-23 ENCOUNTER — Other Ambulatory Visit: Payer: Self-pay | Admitting: Family Medicine

## 2018-06-23 ENCOUNTER — Ambulatory Visit (INDEPENDENT_AMBULATORY_CARE_PROVIDER_SITE_OTHER): Payer: 59 | Admitting: Family Medicine

## 2018-06-23 VITALS — BP 120/80 | HR 90 | Temp 98.9°F | Ht 70.0 in | Wt 239.4 lb

## 2018-06-23 DIAGNOSIS — Z125 Encounter for screening for malignant neoplasm of prostate: Secondary | ICD-10-CM | POA: Diagnosis not present

## 2018-06-23 DIAGNOSIS — Z0000 Encounter for general adult medical examination without abnormal findings: Secondary | ICD-10-CM | POA: Diagnosis not present

## 2018-06-23 DIAGNOSIS — N529 Male erectile dysfunction, unspecified: Secondary | ICD-10-CM | POA: Diagnosis not present

## 2018-06-23 DIAGNOSIS — Z23 Encounter for immunization: Secondary | ICD-10-CM | POA: Diagnosis not present

## 2018-06-23 DIAGNOSIS — R7303 Prediabetes: Secondary | ICD-10-CM

## 2018-06-23 DIAGNOSIS — R972 Elevated prostate specific antigen [PSA]: Secondary | ICD-10-CM

## 2018-06-23 DIAGNOSIS — Z1211 Encounter for screening for malignant neoplasm of colon: Secondary | ICD-10-CM

## 2018-06-23 LAB — COMPREHENSIVE METABOLIC PANEL
ALT: 19 U/L (ref 0–53)
AST: 22 U/L (ref 0–37)
Albumin: 4.6 g/dL (ref 3.5–5.2)
Alkaline Phosphatase: 64 U/L (ref 39–117)
BUN: 13 mg/dL (ref 6–23)
CHLORIDE: 102 meq/L (ref 96–112)
CO2: 29 mEq/L (ref 19–32)
Calcium: 9.7 mg/dL (ref 8.4–10.5)
Creatinine, Ser: 1.22 mg/dL (ref 0.40–1.50)
GFR: 79.25 mL/min (ref >60.00)
GLUCOSE: 88 mg/dL (ref 70–99)
Potassium: 4 mEq/L (ref 3.5–5.1)
SODIUM: 139 meq/L (ref 135–145)
Total Bilirubin: 1.5 mg/dL — ABNORMAL HIGH (ref 0.2–1.2)
Total Protein: 6.8 g/dL (ref 6.0–8.3)

## 2018-06-23 LAB — PSA: PSA: 6.56 ng/mL — ABNORMAL HIGH (ref 0.10–4.00)

## 2018-06-23 LAB — LIPID PANEL
CHOLESTEROL: 157 mg/dL (ref 0–200)
HDL: 40.2 mg/dL (ref >39.00)
LDL CALC: 100 mg/dL — AB (ref 0–99)
NONHDL: 117.02
Total CHOL/HDL Ratio: 4
Triglycerides: 83 mg/dL (ref 0.0–149.0)
VLDL: 16.6 mg/dL (ref 0.0–40.0)

## 2018-06-23 LAB — HEMOGLOBIN A1C: Hgb A1c MFr Bld: 5.9 % (ref 4.6–6.5)

## 2018-06-23 MED ORDER — SILDENAFIL CITRATE 100 MG PO TABS: 50.0000 mg | ORAL_TABLET | Freq: Every day | ORAL | 0 refills | Status: DC | PRN

## 2018-06-23 NOTE — Patient Instructions (Addendum)
The new Shingrix vaccine (for shingles) is a 2 shot series. It can make people feel low energy, achy and almost like they have the flu for 48 hours after injection. Please plan accordingly when deciding on when to get this shot. Call our office for a nurse visit appointment to get this. The second shot of the series is less severe regarding the side effects, but it still lasts 48 hours.   Give [place] 2-3 business days to get the results of your labs back.   Keep the diet clean and stay active.  Aim to do some physical exertion for 150 minutes per week. This is typically divided into 5 days per week, 30 minutes per day. The activity should be enough to get your heart rate up. Anything is better than nothing if you have time constraints.  Viagra, Levitra and Cialis are things to search for manufacturer's coupons for.   Let us know if you need anything.

## 2018-06-23 NOTE — Progress Notes (Signed)
Chief Complaint  Patient presents with  . Annual Exam    [name]   His last physical was >1 year ago.  Current diet: in general, an "OK" diet.  Current exercise: walking 1-2x/week Weight trend: stable Daytime fatigue? No. Seat belt? Yes.    Health maintenance Shingrix- No Colonoscopy- No Tetanus- Yes HIV- Yes Hep C- Yes Prostate cancer screening- No   Past Medical History:  Diagnosis Date  . History of chicken pox   . Hyperlipidemia   . Hypertension   . Lumbago   . Seasonal allergies       Past Surgical History:  Procedure Laterality Date  . NO PAST SURGERIES      Medications  Current Outpatient Medications on File Prior to Visit  Medication Sig Dispense Refill  . losartan-hydrochlorothiazide (HYZAAR) 100-12.5 MG tablet Take 1 tablet by mouth daily. 90 tablet 3  . simvastatin (ZOCOR) 20 MG tablet TAKE 1 TABLET(20 MG) BY MOUTH DAILY 90 tablet 3  . SYMBICORT 80-4.5 MCG/ACT inhaler INHALE 2 PUFFS INTO THE LUNGS TWICE DAILY 10.2 g 0   Allergies No Known Allergies  Family History Family History  Problem Relation Age of Onset  . Diabetes Mother        Living  . Hypertension Father   . Diabetes Father        Deceased  . Lung cancer Father 84  . Diabetes Maternal Grandmother        Deceased  . Hypertension Maternal Grandfather   . Stroke Maternal Grandfather        Deceased  . Diabetes Other        Maternal Aunts & Uncles [8]  . Hypertension Other        Paternal Aunts & Uncles  . Diabetes Sister        #1-Deceased  . Hypertension Sister        #2  . Healthy Daughter        x1    Review of Systems: Constitutional:  no fevers Eye:  no recent significant change in vision Ear/Nose/Mouth/Throat:  Ears:  no hearing loss Nose/Mouth/Throat:  no complaints of nasal congestion, no sore throat Cardiovascular:  no chest pain, no palpitations Respiratory:  no cough and no shortness of  breath Gastrointestinal:  no abdominal pain, no change in bowel habits GU:  Male: negative for dysuria, frequency, and incontinence and negative for prostate symptoms Musculoskeletal/Extremities:  no pain, redness, or swelling of the joints Integumentary (Skin/Breast):  no abnormal skin lesions reported Neurologic:  no headaches Endocrine: No unexpected weight changes Hematologic/Lymphatic:  no abnormal bleeding  Exam BP 120/80 (BP Location: Left Arm, Patient Position: Sitting, Cuff Size: Large)   Pulse 90   Temp 98.9 F (37.2 C) (Oral)   Ht 5\' 10"  (1.778 m)   Wt 239 lb 6 oz (108.6 kg)   SpO2 97%   BMI 34.35 kg/m  General:  well developed, well nourished, in no apparent distress Skin:  no significant moles, warts, or growths Head:  no masses, lesions, or tenderness Eyes:  pupils equal and round, sclera anicteric without injection Ears:  canals without lesions, TMs shiny without retraction, no obvious effusion, no erythema Nose:  nares patent, septum midline, mucosa normal Throat/Pharynx:  lips and gingiva without lesion; tongue and uvula midline; non-inflamed pharynx; no exudates or postnasal drainage Neck: neck supple without adenopathy, thyromegaly, or masses Lungs:  clear to auscultation, breath sounds equal bilaterally, no respiratory distress Cardio:  regular rate and rhythm, no LE edema, no bruits Abdomen:  abdomen soft, nontender; bowel sounds normal; no masses or organomegaly Genital (male): Uncircumcised penis, no lesions or discharge; testes present bilaterally without masses or tenderness Rectal: Deferred Musculoskeletal:  symmetrical muscle groups noted without atrophy or deformity Extremities:  no clubbing, cyanosis, or edema, no deformities, no skin discoloration Neuro:  gait normal; deep tendon reflexes normal and symmetric Psych: well oriented with normal range of affect and appropriate judgment/insight  Assessment and Plan  Well adult exam - Plan: Comprehensive  metabolic panel, Lipid panel  Screening for prostate cancer - Plan: PSA  Prediabetes - Plan: Hemoglobin A1c  Erectile dysfunction, unspecified erectile dysfunction type  Screen for colon cancer - Plan: Ambulatory referral to Gastroenterology   Well 57 y.o. male. Counseled on diet and exercise. Counseled on risks and benefits of prostate cancer screening with PSA. The patient agrees to undergo testing. Immunizations, labs, and further orders as above. Trial sildenafil, will let us know if too expensive. Retail pharmacy info given also. Rec'd to look for online coupons.  Follow up 6 mo or prn. The patient voiced understanding and agreement to the plan.  [place], DO 06/23/18 8:26 AM

## 2018-06-23 NOTE — Progress Notes (Signed)
Pre visit review using our clinic review tool, if applicable. No additional management support is needed unless otherwise documented below in the visit note. 

## 2018-06-23 NOTE — Addendum Note (Signed)
Addended by: Sharon Seller B on: 06/23/2018 08:34 AM   Modules accepted: Orders

## 2018-06-25 ENCOUNTER — Encounter: Payer: Self-pay | Admitting: Family Medicine

## 2018-07-28 ENCOUNTER — Other Ambulatory Visit (INDEPENDENT_AMBULATORY_CARE_PROVIDER_SITE_OTHER): Payer: 59

## 2018-07-28 DIAGNOSIS — R972 Elevated prostate specific antigen [PSA]: Secondary | ICD-10-CM | POA: Diagnosis not present

## 2018-07-28 LAB — PSA: PSA: 7.26 ng/mL — ABNORMAL HIGH (ref 0.10–4.00)

## 2018-07-28 NOTE — Addendum Note (Signed)
Addended by: Ames Coupe on: 07/28/2018 12:37 PM   Modules accepted: Orders

## 2018-08-12 ENCOUNTER — Other Ambulatory Visit: Payer: Self-pay | Admitting: Family Medicine

## 2018-08-12 DIAGNOSIS — J40 Bronchitis, not specified as acute or chronic: Secondary | ICD-10-CM

## 2018-08-21 ENCOUNTER — Encounter: Payer: Self-pay | Admitting: Family Medicine

## 2018-09-05 ENCOUNTER — Telehealth: Payer: Self-pay | Admitting: Family Medicine

## 2018-09-05 NOTE — Telephone Encounter (Signed)
Copied from [place] [phone]. Topic: Quick Communication - See Telephone Encounter >> Sep 05, 2018  4:11 PM Loma [place] wrote: CRM for notification. See Telephone encounter for: 09/05/18.Pt has a Good rx coupon for sildenafil (VIAGRA) 100 MG tablet but it is good for up to 30 tablets. He is trying to save money and wants to know if Dr Nani Ravens can write the script for the 30 tablets

## 2018-09-08 MED ORDER — SILDENAFIL CITRATE 100 MG PO TABS: 50.0000 mg | ORAL_TABLET | Freq: Every day | ORAL | 0 refills | Status: DC | PRN

## 2018-09-08 NOTE — Telephone Encounter (Signed)
Patient can not use the coupon at [facility] , he can use it at [facility], can that be sent to [place]?

## 2018-09-08 NOTE — Addendum Note (Signed)
Addended by: Sharon Seller B on: 09/08/2018 02:56 PM   Modules accepted: Orders

## 2018-09-08 NOTE — Telephone Encounter (Signed)
Sent in to [facility] and patient informed

## 2018-09-18 DIAGNOSIS — H52223 Regular astigmatism, bilateral: Secondary | ICD-10-CM | POA: Diagnosis not present

## 2018-10-12 ENCOUNTER — Other Ambulatory Visit: Payer: Self-pay | Admitting: Family Medicine

## 2018-10-12 DIAGNOSIS — J40 Bronchitis, not specified as acute or chronic: Secondary | ICD-10-CM

## 2018-10-29 DIAGNOSIS — R972 Elevated prostate specific antigen [PSA]: Secondary | ICD-10-CM | POA: Diagnosis not present

## 2018-11-16 ENCOUNTER — Other Ambulatory Visit: Payer: Self-pay | Admitting: Family Medicine

## 2018-12-12 ENCOUNTER — Other Ambulatory Visit: Payer: Self-pay | Admitting: Family Medicine

## 2018-12-12 DIAGNOSIS — I10 Essential (primary) hypertension: Secondary | ICD-10-CM

## 2018-12-25 ENCOUNTER — Other Ambulatory Visit: Payer: Self-pay | Admitting: Family Medicine

## 2018-12-25 DIAGNOSIS — J40 Bronchitis, not specified as acute or chronic: Secondary | ICD-10-CM

## 2018-12-26 ENCOUNTER — Ambulatory Visit: Payer: 59 | Admitting: Family Medicine

## 2019-01-12 ENCOUNTER — Encounter: Payer: Self-pay | Admitting: Radiation Oncology

## 2019-01-12 NOTE — Progress Notes (Signed)
Opened in error

## 2019-01-13 ENCOUNTER — Ambulatory Visit
Admission: RE | Admit: 2019-01-13 | Discharge: 2019-01-13 | Disposition: A | Discharge status: Home / Self Care | Payer: 59 | Source: Ambulatory Visit | Attending: Radiation Oncology | Admitting: Radiation Oncology

## 2019-01-13 ENCOUNTER — Ambulatory Visit: Admission: RE | Admit: 2019-01-13 | Payer: 59 | Source: Ambulatory Visit

## 2019-01-13 HISTORY — DX: Malignant neoplasm of prostate: C61

## 2019-01-15 ENCOUNTER — Telehealth: Payer: Self-pay | Admitting: Radiation Oncology

## 2019-01-16 ENCOUNTER — Ambulatory Visit: Admission: RE | Admit: 2019-01-16 | Payer: 59 | Source: Ambulatory Visit

## 2019-01-16 ENCOUNTER — Other Ambulatory Visit: Payer: Self-pay

## 2019-01-16 ENCOUNTER — Ambulatory Visit
Admission: RE | Admit: 2019-01-16 | Discharge: 2019-01-16 | Disposition: A | Discharge status: Home / Self Care | Payer: 59 | Source: Ambulatory Visit | Attending: Radiation Oncology | Admitting: Radiation Oncology

## 2019-01-16 ENCOUNTER — Encounter: Payer: Self-pay | Admitting: Radiation Oncology

## 2019-01-16 VITALS — Ht 70.0 in | Wt 228.0 lb

## 2019-01-16 DIAGNOSIS — C61 Malignant neoplasm of prostate: Secondary | ICD-10-CM

## 2019-01-16 NOTE — Progress Notes (Signed)
Radiation Oncology         [phone] ________________________________  Initial outpatient Consultation - Conducted via WebEx due to current COVID-19 concerns for limiting patient exposure  Name: David [name] MRN: [id number]  Date: 01/16/2019  DOB: 18-Feb-1961  IR:[id number], Crosby Oyster, DO  Irine Seal, MD   REFERRING PHYSICIAN: Irine Seal, MD  DIAGNOSIS: 58 y.o. gentleman with Stage T2a adenocarcinoma of the prostate with Gleason score of 3+4, and PSA of 6.39.    ICD-10-CM   1. Malignant neoplasm of prostate ([place])  C61     HISTORY OF PRESENT ILLNESS: David [name] is a 58 y.o. malewith a diagnosis of prostate cancer. He was noted to have an elevated PSA of 6.5 in 05/2018 and further elevated at 7.26 in 06/2018 by his primary care physician, Dr. Nani Ravens.  Accordingly, he was referred for evaluation in urology with Dr. Jeffie Pollock on 10/29/2018,  digital rectal examination was performed at that time revealing an abnormal exam with asymmetry and firmness at the right base.  PSA was repeated that same day and remained elevated at 6.39.  The patient proceeded to transrectal ultrasound with 12 biopsies of the prostate on 12/24/2018.  The prostate volume measured 73 cc.  Out of 13 core biopsies, 7 were positive.  The maximum Gleason score was 3+4, and this was seen in two cores from right base lateral. Gleason 3+3 was also seen in right mid lateral, right mid, left base lateral, left base, and left mid.  The patient reviewed the biopsy results with his urologist and he has kindly been referred today for discussion of potential radiation treatment options.  He is also scheduled for a consult with Dr. Alinda Money on Tuesday 01/20/19 to further discuss surgical treatment options.  PREVIOUS RADIATION THERAPY: No  PAST MEDICAL HISTORY:  Past Medical History:  Diagnosis Date  . History of chicken pox   . Hyperlipidemia   . Hypertension   . Lumbago   . Prostate cancer ([place])   . Seasonal allergies       PAST SURGICAL HISTORY: Past Surgical History:  Procedure Laterality Date  . PROSTATE BIOPSY      FAMILY HISTORY:  Family History  Problem Relation Age of Onset  . Diabetes Mother        Living  . Hypertension Father   . Diabetes Father        Deceased  . Lung cancer Father 57  . Diabetes Maternal Grandmother        Deceased  . Hypertension Maternal Grandfather   . Stroke Maternal Grandfather        Deceased  . Diabetes Other        Maternal Aunts & Uncles [8]  . Hypertension Other        Paternal Aunts & Uncles  . Diabetes Sister        #1-Deceased  . Hypertension Sister        #2  . Healthy Daughter        x1  . Breast cancer Neg Hx   . Colon cancer Neg Hx   . Pancreatic cancer Neg Hx     SOCIAL HISTORY:  Social History   Socioeconomic History  . Marital status: Married    Spouse name: Not on file  . Number of children: 2  . Years of education: Not on file  . Highest education level: Not on file  Occupational History  . Not on file  Social Needs  . Financial resource strain: Not on file  .  Food insecurity    Worry: Not on file    Inability: Not on file  . Transportation needs    Medical: Not on file    Non-medical: Not on file  Tobacco Use  . Smoking status: Never Smoker  . Smokeless tobacco: Never Used  Substance and Sexual Activity  . Alcohol use: No    Alcohol/week: 0.0 standard drinks  . Drug use: No  . Sexual activity: Yes  Lifestyle  . Physical activity    Days per week: Not on file    Minutes per session: Not on file  . Stress: Not on file  Relationships  . Social Herbalist on phone: Not on file    Gets together: Not on file    Attends religious service: Not on file    Active member of club or organization: Not on file    Attends meetings of clubs or organizations: Not on file    Relationship status: Not on file  . Intimate partner violence    Fear of current or ex partner: Not on file    Emotionally abused: Not on file     Physically abused: Not on file    Forced sexual activity: Not on file  Other Topics Concern  . Not on file  Social History Narrative  . Not on file    ALLERGIES: Patient has no known allergies.  MEDICATIONS:  Current Outpatient Medications  Medication Sig Dispense Refill  . budesonide-formoterol (SYMBICORT) 80-4.5 MCG/ACT inhaler INHALE 2 PUFFS INTO THE LUNGS TWICE DAILY 10.2 g 0  . losartan-hydrochlorothiazide (HYZAAR) 100-12.5 MG tablet TAKE 1 TABLET BY MOUTH DAILY 90 tablet 3  . sildenafil (VIAGRA) 100 MG tablet take half to one tablet by mouth daily as needed for erectile dysfunction  30 tablet 0  . simvastatin (ZOCOR) 20 MG tablet TAKE 1 TABLET(20 MG) BY MOUTH DAILY 90 tablet 3   No current facility-administered medications for this encounter.     REVIEW OF SYSTEMS:  On review of systems, the patient reports that he is doing well overall. He denies any chest pain, shortness of breath, cough, fevers, chills, night sweats, unintended weight changes. He denies any bowel disturbances, and denies abdominal pain, nausea or vomiting. He denies any new musculoskeletal or joint aches or pains. His IPSS was 5, indicating mild urinary symptoms. He reports blood in his semen from the biopsy is gradually improving. His SHIM was 21, indicating he does not have erectile dysfunction. A complete review of systems is obtained and is otherwise negative.  PHYSICAL EXAM:  Wt Readings from Last 3 Encounters:  01/16/19 228 lb (103.4 kg)  06/23/18 239 lb 6 oz (108.6 kg)  12/06/17 241 lb (109.3 kg)   Temp Readings from Last 3 Encounters:  06/23/18 98.9 F (37.2 C) (Oral)  12/06/17 98.4 F (36.9 C) (Oral)  08/17/16 98.1 F (36.7 C) (Oral)   BP Readings from Last 3 Encounters:  06/23/18 120/80  12/06/17 110/82  08/17/16 110/68   Pulse Readings from Last 3 Encounters:  06/23/18 90  12/06/17 73  08/17/16 83    In general this is a well appearing African American gentleman in no acute  distress. He's alert and oriented x4 and appropriate throughout the examination. Cardiopulmonary assessment is negative for acute distress and he exhibits normal effort.   KPS = 100  100 - Normal; no complaints; no evidence of disease. 90   - Able to carry on normal activity; minor signs or symptoms of disease.  80   - Normal activity with effort; some signs or symptoms of disease. 43   - Cares for self; unable to carry on normal activity or to do active work. 60   - Requires occasional assistance, but is able to care for most of his personal needs. 50   - Requires considerable assistance and frequent medical care. 32   - Disabled; requires special care and assistance. 33   - Severely disabled; hospital admission is indicated although death not imminent. 75   - Very sick; hospital admission necessary; active supportive treatment necessary. 10   - Moribund; fatal processes progressing rapidly. 0     - Dead  Karnofsky DA, Abelmann [place], Craver LS and Burchenal [facility] [phone]) The use of the nitrogen mustards in the palliative treatment of carcinoma: with particular reference to bronchogenic carcinoma Cancer 1 634-56  LABORATORY DATA:  Lab Results  Component Value Date   WBC 12.8 (H) 08/04/2016   HGB 17.3 (H) 08/04/2016   HCT 50.7 08/04/2016   MCV 80.2 08/04/2016   PLT 290 08/04/2016   Lab Results  Component Value Date   NA 139 06/23/2018   K 4.0 06/23/2018   CL 102 06/23/2018   CO2 29 06/23/2018   Lab Results  Component Value Date   ALT 19 06/23/2018   AST 22 06/23/2018   ALKPHOS 64 06/23/2018   BILITOT 1.5 (H) 06/23/2018     RADIOGRAPHY: No results found.    IMPRESSION/PLAN:  This visit was conducted via WebEx to spare the patient unnecessary potential exposure in the healthcare setting during the current COVID-19 pandemic.  1. 58 y.o. gentleman with Stage T2a adenocarcinoma of the prostate with Gleason Score of 3+4, and PSA of 6.39. We discussed the patient's workup and outlined  the nature of prostate cancer in this setting. The patient's T stage, Gleason's score, and PSA put him into the favorable intermediate risk (FIR) group. Accordingly, he is eligible for a variety of potential treatment options including brachytherapy, 5.5 weeks of external radiation or prostatectomy. We discussed the available radiation techniques, and focused on the details and logistics and delivery. The patient may not be an ideal candidate for brachytherapy boost with a prostate volume of 73 prior to downsizing from hormone therapy. However, we discussed that based on his prostate volume, if he elects to proceed with brachytherapy, we would recommend beginning treatment with a 5 alpha reductase inhibitor for at least 3 months to allow for downsizing of the prostate prior to initiating radiotherapy. We could then reassess the prostate volume on his CT pubic arch study at the time of treatment planning and confirm whether he is a candidate for brachytherapy. We discussed and outlined the risks, benefits, short and long-term effects associated with radiotherapy and compared and contrasted these with prostatectomy. We discussed the role of SpaceOAR in reducing the rectal toxicity associated with radiotherapy.   At the conclusion of our conversation, the patient remains undecided regarding his treatment preference and would like to continue to explore his treatment options at the time of consult with Dr. Alinda Money on 01/20/19. We will share this information with Dr. Jeffie Pollock and Dr. Alinda Money and look forward to hearing from the patient soon regarding his final decision for treatment.  We would be more than happy to continue to participate in his care should he elect to proceed with radiation.  Given current concerns for patient exposure during the COVID-19 pandemic, this encounter was conducted via video-enabled WebEx visit. The patient has given verbal  consent for this type of encounter and was advised to only accept a  visit of this type in a secure network environment. The time spent during this encounter was 60 minutes, with 50% of that time spent in counseling and coordination of his care. The attendants for this meeting include Tyler Pita MD, Ashlyn Bruning PA-C, [place], and patient, David [name]. During the encounter, Tyler Pita MD, Ashlyn Bruning PA-C, and scribe, Wilburn Mylar were located at [place].  Patient, David [name] was located at home.    Nicholos Johns, PA-C    Tyler Pita, MD  [facility] Direct Dial: [phone]  Fax: [phone] [email or web address]  Skype  [email or web address]   This document serves as a record of services personally performed by Tyler Pita, MD and Freeman Caldron, PA-C. It was created on their behalf by Wilburn Mylar, a trained medical scribe. The creation of this record is based on the scribe's personal observations and the provider's statements to them. This document has been checked and approved by the attending provider.

## 2019-01-16 NOTE — Progress Notes (Signed)
See progress note under physician encounter. 

## 2019-01-16 NOTE — Progress Notes (Signed)
GU Location of Tumor / Histology: prostatic adenocarcinoma  If Prostate Cancer, Gleason Score is (3 + 4) and PSA is (6.39) on 10/29/2018. Prostate volume: 73 mL.   David [name] was referred to Dr. Jeffie Pollock by Dr. Nani Ravens for an elevated PSA of 6.56 in 05/2018 and 7.26 in 06/2018.   Biopsies of prostate (if applicable) revealed:    Past/Anticipated interventions by urology, if any: prostate biopsy, referral for consideration of radiotherapy.   Past/Anticipated interventions by medical oncology, if any: no  Weight changes, if any: no  Bowel/Bladder complaints, if any: IPSS 5. SHIM 21. Denies dysuria or hematuria. Reports blood in semen is gradually improving since biopsy.Reports good sexual function with mild LUTS. Denies urinary leakage or incontinence.   Nausea/Vomiting, if any: no  Pain issues, if any:  no  SAFETY ISSUES:  Prior radiation? no  Pacemaker/ICD? no  Possible current pregnancy? no, male patient  Is the patient on methotrexate? no  Current Complaints / other details:  58 year old male. Married with one son and one daughter. NKDA. Leaning toward seeds but require downsizing.

## 2019-01-19 ENCOUNTER — Other Ambulatory Visit: Payer: Self-pay | Admitting: Family Medicine

## 2019-01-19 DIAGNOSIS — E785 Hyperlipidemia, unspecified: Secondary | ICD-10-CM

## 2019-01-23 ENCOUNTER — Telehealth: Payer: Self-pay | Admitting: Medical Oncology

## 2019-01-23 NOTE — Telephone Encounter (Signed)
Left message to introduce myself as the prostate nurse navigator and discuss my role. I was unable to meet him 6/19 when he consulted with Dr. Tammi Klippel to discuss his radiation options. He had an appointment with Dr. Alinda Money 6/23  to discuss robotic prostatectomy. I informed him I am working remotely and gave him my office number. I asked him to call me with his treatment decision, questions or concerns and I will be happy to return his call.

## 2019-03-02 ENCOUNTER — Telehealth: Payer: Self-pay | Admitting: Radiation Oncology

## 2019-03-02 NOTE — Telephone Encounter (Signed)
Received voicemail message from patient expressing his desire to move forward with "seeds" in the treatment of his prostate ca. Patient consulted by Dr. Tammi Klippel on 01/16/2019 and was undecided at that time but did have an appointment with Dr. Alinda Money on 01/20/2019 to discuss his surgical options. Phoned patient back to inform him I received his message and inquire if he has received a [place] or Lupron injection. No answer on patient's phone. This RN left a detailed message with my direct contact number requesting a return call on the patient's voicemail. Awaiting return call.

## 2019-03-02 NOTE — Telephone Encounter (Signed)
Sam, Please let Mr. Vuncannon know that, based on our discussion at time of consult in 12/2018, if he elects to proceed with brachytherapy, he would not need ADT, but he will need to be on an oral medication such as Proscar or Avodart (5-ARI) for at least 3 months, to downsize his prostate prior to proceeding with brachytherapy. This would need to be arranged to start as soon as possible through his Urologist, Dr. Jeffie Pollock so hopefully Enid Derry could coordinate a follow up visit with Dr. Jeffie Pollock ASAP to facilitate this and we could plan for his CT [facility] approximately 3 months thereafter to re-evaluate prostate size at time of pre-seed SIM. Thank you! -Kholton Coate

## 2019-03-03 ENCOUNTER — Telehealth: Payer: Self-pay | Admitting: Radiation Oncology

## 2019-03-03 ENCOUNTER — Telehealth: Payer: Self-pay | Admitting: *Deleted

## 2019-03-03 NOTE — Telephone Encounter (Signed)
Called patient to inform that I have his appt. with Dr. Jeffie Pollock rescheduled for 03-09-19 - arrival time- 10:15 am, lvm for a return call.

## 2019-03-03 NOTE — Telephone Encounter (Signed)
Attempted a second time to follow up with patient and he answered. Patient confirms he wishes to move forward with seeds to treat his prostate cancer. I explained the following as directed by [employer], PA-C: he will need to be on an oral medication such as Proscar or Avodart (5-ARI) for at least 3 months, to downsize his prostate prior to proceeding with brachytherapy. This would need to be arranged to start as soon as possible through his Urologist, Dr. Jeffie Pollock. Patient questioned potential side effects associated with 5-ARI. Encouraged patient to question Dr. Jeffie Pollock but explained generally speaking breast enlargement, tenderness and ED can all be associated with 5-ARI. Patient verbalized understanding. Explained next steps: Romie Jumper will contact Dr. Ralene Muskrat office to get the ball rolling then phone him back with the appointment. Explained we will plan for his CT SIM approximately 3 months thereafter to re-evaluate his prostate size at time of pre seed sim. Again, patient verbalized understanding. Provided patient with my direct number for future questions or concerns reference this matter.

## 2019-03-03 NOTE — Telephone Encounter (Signed)
CALLED PATIENT TO INFORM OF APPT. WITH DR. Jeffie Pollock ON 03-04-19 - ARRIVAL TIME- 2:15 PM, LVM FOR A RETURN CALL

## 2019-04-01 ENCOUNTER — Other Ambulatory Visit: Payer: Self-pay | Admitting: Family Medicine

## 2019-05-06 ENCOUNTER — Other Ambulatory Visit: Payer: Self-pay | Admitting: *Deleted

## 2019-05-06 DIAGNOSIS — J40 Bronchitis, not specified as acute or chronic: Secondary | ICD-10-CM

## 2019-05-06 MED ORDER — BUDESONIDE-FORMOTEROL FUMARATE 80-4.5 MCG/ACT IN AERO: 2.0000 | INHALATION_SPRAY | Freq: Two times a day (BID) | RESPIRATORY_TRACT | 1 refills | Status: DC

## 2019-05-13 ENCOUNTER — Telehealth: Payer: Self-pay | Admitting: *Deleted

## 2019-05-13 ENCOUNTER — Other Ambulatory Visit: Payer: Self-pay | Admitting: Urology

## 2019-05-13 NOTE — Telephone Encounter (Signed)
CALLED PATIENT TO REMIND OF PRE-SEED APPTS. FOR 05-14-19, UNABLE TO LEAVE MESSAGE, VM FULL

## 2019-05-14 ENCOUNTER — Ambulatory Visit: Payer: 59 | Admitting: Urology

## 2019-05-14 ENCOUNTER — Ambulatory Visit: Payer: 59 | Admitting: Radiation Oncology

## 2019-05-14 ENCOUNTER — Telehealth: Payer: Self-pay | Admitting: *Deleted

## 2019-05-14 NOTE — Telephone Encounter (Signed)
CALLED PATIENT TO INFORM OF PRE-SEED APPTS. FOR 05-28-19, SPOKE WITH PATIENT AND HE IS AWARE OF THESE APPTS.

## 2019-05-27 ENCOUNTER — Telehealth: Payer: Self-pay | Admitting: *Deleted

## 2019-05-27 NOTE — Telephone Encounter (Signed)
CALLED PATIENT TO REMIND OF PRE-SEED APPTS. FOR 05-28-19, LVM FOR A RETURN CALL

## 2019-05-28 ENCOUNTER — Ambulatory Visit
Admission: RE | Admit: 2019-05-28 | Discharge: 2019-05-28 | Disposition: A | Discharge status: Home / Self Care | Payer: 59 | Source: Ambulatory Visit | Attending: Radiation Oncology | Admitting: Radiation Oncology

## 2019-05-28 ENCOUNTER — Other Ambulatory Visit: Payer: Self-pay

## 2019-05-28 ENCOUNTER — Encounter (HOSPITAL_COMMUNITY)
Admission: RE | Admit: 2019-05-28 | Discharge: 2019-05-28 | Disposition: A | Discharge status: Home / Self Care | Payer: 59 | Source: Ambulatory Visit | Attending: Urology | Admitting: Urology

## 2019-05-28 ENCOUNTER — Ambulatory Visit (HOSPITAL_COMMUNITY)
Admission: RE | Admit: 2019-05-28 | Discharge: 2019-05-28 | Disposition: A | Discharge status: Home / Self Care | Payer: 59 | Source: Ambulatory Visit | Attending: Urology | Admitting: Urology

## 2019-05-28 ENCOUNTER — Encounter: Payer: Self-pay | Admitting: Medical Oncology

## 2019-05-28 ENCOUNTER — Ambulatory Visit
Admission: RE | Admit: 2019-05-28 | Discharge: 2019-05-28 | Disposition: A | Discharge status: Home / Self Care | Payer: 59 | Source: Ambulatory Visit | Attending: Urology | Admitting: Urology

## 2019-05-28 DIAGNOSIS — C61 Malignant neoplasm of prostate: Secondary | ICD-10-CM

## 2019-05-28 NOTE — Addendum Note (Signed)
Encounter addended by: Tyler Pita, MD on: 05/28/2019 9:22 AM  Actions taken: Medication List reviewed, Problem List reviewed, Allergies reviewed, Pend clinical note, Clinical Note Signed

## 2019-05-28 NOTE — Progress Notes (Addendum)
  Radiation Oncology         [phone] ________________________________  Name: David [name] MRN: [id number]  Date: 05/28/2019  DOB: [birth year]  SIMULATION AND TREATMENT PLANNING NOTE PUBIC ARCH STUDY  [id number], Crosby Oyster, DO  Irine Seal, MD  DIAGNOSIS: 58 y.o. gentleman with Stage T2a adenocarcinoma of the prostate with Gleason score of 3+4, and PSA of 6.39.     ICD-10-CM   1. Malignant neoplasm of prostate ([place])  C61     COMPLEX SIMULATION:  The patient presented today for evaluation for possible prostate seed implant. He was brought to the radiation planning suite and placed supine on the CT couch. A 3-dimensional image study set was obtained in upload to the planning computer. There, on each axial slice, I contoured the prostate gland. Then, using three-dimensional radiation planning tools I reconstructed the prostate in view of the structures from the transperineal needle pathway to assess for possible pubic arch interference. In doing so, I did not appreciate any pubic arch interference. Also, the patient's prostate volume was estimated based on the drawn structure. The volume was 64 cc.  Given the pubic arch appearance and prostate volume, patient remains a good candidate to proceed with prostate seed implant. Today, he freely provided informed written consent to proceed.    PLAN: The patient will undergo prostate seed implant.   ________________________________  Sheral Apley. Tammi Klippel, M.D.

## 2019-05-29 ENCOUNTER — Other Ambulatory Visit: Payer: Self-pay | Admitting: Urology

## 2019-05-29 DIAGNOSIS — C61 Malignant neoplasm of prostate: Secondary | ICD-10-CM

## 2019-06-11 ENCOUNTER — Ambulatory Visit: Payer: Self-pay | Admitting: Urology

## 2019-06-11 ENCOUNTER — Ambulatory Visit: Payer: 59 | Admitting: Radiation Oncology

## 2019-06-15 ENCOUNTER — Other Ambulatory Visit (HOSPITAL_COMMUNITY)
Admission: RE | Admit: 2019-06-15 | Discharge: 2019-06-15 | Disposition: A | Discharge status: Home / Self Care | Payer: 59 | Source: Ambulatory Visit | Attending: Urology | Admitting: Urology

## 2019-06-15 ENCOUNTER — Encounter (HOSPITAL_COMMUNITY)
Admission: RE | Admit: 2019-06-15 | Discharge: 2019-06-15 | Disposition: A | Discharge status: Home / Self Care | Payer: 59 | Source: Ambulatory Visit | Attending: Urology | Admitting: Urology

## 2019-06-15 ENCOUNTER — Other Ambulatory Visit: Payer: Self-pay

## 2019-06-15 DIAGNOSIS — Z20828 Contact with and (suspected) exposure to other viral communicable diseases: Secondary | ICD-10-CM | POA: Insufficient documentation

## 2019-06-15 DIAGNOSIS — Z01812 Encounter for preprocedural laboratory examination: Secondary | ICD-10-CM | POA: Insufficient documentation

## 2019-06-15 LAB — CBC
HCT: 46.9 % (ref 39.0–52.0)
Hemoglobin: 15.3 g/dL (ref 13.0–17.0)
MCH: 27.5 pg (ref 26.0–34.0)
MCHC: 32.6 g/dL (ref 30.0–36.0)
MCV: 84.2 fL (ref 80.0–100.0)
Platelets: 243 10*3/uL (ref 150–400)
RBC: 5.57 MIL/uL (ref 4.22–5.81)
RDW: 14 % (ref 11.5–15.5)
WBC: 5.6 10*3/uL (ref 4.0–10.5)
nRBC: 0 % (ref 0.0–0.2)

## 2019-06-15 LAB — COMPREHENSIVE METABOLIC PANEL
ALT: 22 U/L (ref 0–44)
AST: 26 U/L (ref 15–41)
Albumin: 4.7 g/dL (ref 3.5–5.0)
Alkaline Phosphatase: 70 U/L (ref 38–126)
Anion gap: 8 (ref 5–15)
BUN: 14 mg/dL (ref 6–20)
CO2: 25 mmol/L (ref 22–32)
Calcium: 9.5 mg/dL (ref 8.9–10.3)
Chloride: 102 mmol/L (ref 98–111)
Creatinine, Ser: 1.17 mg/dL (ref 0.61–1.24)
GFR calc Af Amer: >60 mL/min (ref >60)
GFR calc non Af Amer: >60 mL/min (ref >60)
Glucose, Bld: 100 mg/dL — ABNORMAL HIGH (ref 70–99)
Potassium: 3.6 mmol/L (ref 3.5–5.1)
Sodium: 135 mmol/L (ref 135–145)
Total Bilirubin: 1.2 mg/dL (ref 0.3–1.2)
Total Protein: 7.5 g/dL (ref 6.5–8.1)

## 2019-06-15 LAB — PROTIME-INR
INR: 1.1 (ref 0.8–1.2)
Prothrombin Time: 13.7 seconds (ref 11.4–15.2)

## 2019-06-15 LAB — APTT: aPTT: 31 seconds (ref 24–36)

## 2019-06-15 LAB — SARS CORONAVIRUS 2 (TAT 6-24 HRS): SARS Coronavirus 2: NEGATIVE

## 2019-06-17 ENCOUNTER — Other Ambulatory Visit: Payer: Self-pay

## 2019-06-17 ENCOUNTER — Telehealth: Payer: Self-pay | Admitting: *Deleted

## 2019-06-17 ENCOUNTER — Encounter (HOSPITAL_BASED_OUTPATIENT_CLINIC_OR_DEPARTMENT_OTHER): Payer: Self-pay | Admitting: *Deleted

## 2019-06-17 NOTE — Telephone Encounter (Signed)
RETURNED PATIENT'S PHONE CALL, LVM FOR A RETURN CALL 

## 2019-06-17 NOTE — Telephone Encounter (Signed)
Called patient to remind of procedure for 06-18-19, lvm for a return call

## 2019-06-17 NOTE — H&P (Signed)
CC/HPI: CC: Prostate Cancer   [name] returns today in f/u in preparation for his a seed implant on 11/19 to treat his prostate cancer. A prostate [place] today measured a volume of 68ml. He had seen Dr. Tammi Klippel and had a planning CT that showed 63ml and no pubic arch interference. His prostate volume was 58m prior to dutasteride. He is voiding well. He has no other associated signs or symptoms. His PSA has only fallen to 5.93 from 6.39 on the dutasteride. UA today is clear.   David [name] is a 58 year old gentleman who was noted to have an elevated PSA of 6.39 and induration of the right base of the prostate. He underwent a TRUS biopsy on 12/24/18 that demonstrated a 1 cm hypoechoic area at the right base and Gleason 3+4=7 adenocarcinoma with 7 out of 13 biopsy cores positive for malignancy. He has been thoroughly counseled by both Dr. Jeffie Pollock and Dr. Tammi Klippel (radiation oncology).   Family history: None.   Imaging studies: None.   PMH: He has a history of hypertension, asthma, and hyperlipidemia.  PSH: No abdominal surgeries.   TNM stage: cT2a Nx Mx (R induration)  PSA: 6.39  Gleason score: 3+4=7 (Grade group 2)  Biopsy (12/24/18): 7/13 cores positive  Left: L mid (50%, 3+3=6), L lateral base (5%, 3+3=6), L base (40%, 3+3=6)  Right: R mid (40%, 3+3=6), R lateral mid (40%, 3+3=6), R lateral base (2/2 cores, 60%, 10%, 3+4=7)  Prostate volume: 73.0 cc   Nomogram  OC disease: 54%  EPE: 44%  SVI: 5%  LNI: 7%  PFS (5 year, 10 year): 80%, 69%   Urinary function: IPSS is 6.  Erectile function: SHIM score is 22.     ALLERGIES: No Allergies    MEDICATIONS: Dutasteride 0.5 mg capsule 1 capsule PO Daily  Losartan Potassium-HCTZ 50-12.5 MG Oral Tablet Oral  Simvastatin 20 MG Oral Tablet Oral  Symbicort     GU PSH: Prostate Needle Biopsy - 12/24/2018       PSH Notes: Encounter for contraceptive planning, No Surgical Problems   NON-GU PSH: Surgical Pathology, Gross And Microscopic Examination  For Prostate Needle - 12/24/2018     GU PMH: Nocturia - 03/09/2019 Prostate Cancer, T2a Nx Mx favorable intermediate risk prostate cancer with a volume of 28ml. he has good sexual function and mild LUTS. He has HTN and hypercholesterolemia. I discussed AS, RALP, EXRT and seeds and briefly mentioned cryo and HIFU. I don't think he is a good candidate for AS with the extent of his disease and young age. He is a good candidate for RALP and I explained that most men with his disease and age would be treated with surgery but that radiation is an option. His prostate is 56ml so seeds might require downsizing with either finasteride or short course ADT but he is interested in learning more about seeds as well as surgery. I will get him seen by the [facility]. - 01/08/2019 Elevated PSA - 12/24/2018, He has an elevated PSA with prostate asymmetry and induration at the right base. I am going to schedule him for a prostate [place] and biopsy. I reviewed the risks of bleeding, infection and voiding difficulty. , - 10/29/2018 Prostate nodule w/ LUTS - 10/29/2018 Encounter for sterilization, Encounter for sterilization - 2014      PMH Notes:  1898-07-30 00:00:00 - Note: Normal Routine History And Physical Adult   NON-GU PMH: Hypercholesterolemia Hypertension    FAMILY HISTORY: Death In The Family Father -  Runs In Family Family Health Status - Mother's Age - Runs In Family Family Health Status Number - Runs In Family Hypertension - Father Lung Cancer - Father   SOCIAL HISTORY: Marital Status: Married Preferred Language: English; Ethnicity: Not Hispanic Or Latino; Race: Black or African American Current Smoking Status: Patient has never smoked.   Tobacco Use Assessment Completed: Used Tobacco in last 30 days? Drinks 2 drinks per week.  Drinks 1 caffeinated drink per day.     Notes: 1 son, 1 daughter    REVIEW OF SYSTEMS:    GU Review Male:   Patient denies frequent urination, hard to postpone urination, burning/ pain  with urination, get up at night to urinate, leakage of urine, stream starts and stops, trouble starting your stream, have to strain to urinate , erection problems, and penile pain.  Gastrointestinal (Upper):   Patient denies nausea, vomiting, and indigestion/ heartburn.  Gastrointestinal (Lower):   Patient denies diarrhea and constipation.  Constitutional:   Patient denies fever, night sweats, weight loss, and fatigue.  Skin:   Patient denies skin rash/ lesion and itching.  Eyes:   Patient denies blurred vision and double vision.  Ears/ Nose/ Throat:   Patient denies sore throat and sinus problems.  Hematologic/Lymphatic:   Patient denies swollen glands and easy bruising.  Cardiovascular:   Patient denies leg swelling and chest pains.  Respiratory:   Patient denies cough and shortness of breath.  Endocrine:   Patient denies excessive thirst.  Musculoskeletal:   Patient denies back pain and joint pain.  Neurological:   Patient denies headaches and dizziness.  Psychologic:   Patient denies depression and anxiety.   VITAL SIGNS:      06/10/2019 03:39 PM  Weight 235 lb / 106.59 kg  Height 70 in / 177.8 cm  BP 156/74 mmHg  Pulse 99 /min  Temperature 98.4 F / 36.8 C  BMI 33.7 kg/m   MULTI-SYSTEM PHYSICAL EXAMINATION:    Constitutional: Well-nourished. No physical deformities. Normally developed. Good grooming.  Respiratory: Normal breath sounds. No labored breathing, no use of accessory muscles.   Cardiovascular: Regular rate and rhythm. No murmur, no gallop.     PAST DATA REVIEWED:  Source Of History:  Patient  Lab Test Review:   PSA  Records Review:   Previous Doctor Records  X-Ray Review: Prostate Ultrasound: Reviewed Films. Discussed With Patient.     06/04/19 10/29/18  PSA  Total PSA 5.93 ng/mL 6.39 ng/mL  Free PSA 0.41 ng/mL 0.70 ng/mL  % Free PSA 7 % PSA 11 % PSA   Notes:                     Notes from Dr. Tammi Klippel reviewed.    PROCEDURES:         Prostate Ultrasound [id number]  Length:5.45cm Height:4.56cm Width:5.26cm Volume:68.26ml      Inhomogeneous Appearing Prostate with 0.88cm Area in Mid Prostate Patient confirmed No Neulasta OnPro Device.    The transrectal ultrasound probe is introduced into the rectum, and the prostate is visualized. Ultrasonography is utilized throughout the procedure. At the conclusion of the procedure, the ultrasound probe is removed. The patient tolerates the procedure without complication.         Urinalysis Dipstick Dipstick Cont'd  Color: Yellow Bilirubin: Neg mg/dL  Appearance: Clear Ketones: Neg mg/dL  Specific Gravity: 1.015 Blood: Neg ery/uL  pH: 6.5 Protein: Neg mg/dL  Glucose: Neg mg/dL Urobilinogen: 0.2 mg/dL    Nitrites: Neg  Leukocyte Esterase: Neg leu/uL    ASSESSMENT:      ICD-10 Details  1 GU:   Prostate Cancer - C61 He will proceed with the seed implant on 11/19. He had sufficient downsizing with dutasteride but the PSA only fell slightly.   2   Prostate nodule w/ LUTS - N40.3   3   Elevated PSA - R97.20    PLAN:           Schedule Return Visit/Planned Activity: Keep Scheduled Appointment          Document Letter(s):  Created for Patient: Clinical Summary         Notes:   CC: Dr. Riki Sheer and Dr. Ledon Snare.         Next Appointment:      Next Appointment: 06/18/2019 12:00 PM    Appointment Type: Surgery     Location: [facility] [phone]    Provider: Irine Seal, M.D.    Reason for Visit: OP [place]

## 2019-06-17 NOTE — Progress Notes (Addendum)
ADDENDUM:  Pt called back via phone to let me know that he takes another medication in the morning , avodart .  Pt verbalized understanding he may take this morning of surgery. Updated medication list.  Spoke w/ via phone for pre-op interview--- PT Lab needs dos----  None             Lab results------ CBC, CMP, PT/ PTT, CXR, EKG in chart/ epic COVID test ------  06-15-2019 Arrive at -------  1015 NPO after ------  [place] Medications to take morning of surgery ----- Zocor, Symbicort inhaler w/ sips of water Diabetic medication ----- n/a Patient Special Instructions ----- pt instructed to do symbicort inhaler night before surgery also and to do one fleet enema morning of surgery Pre-Op special Istructions ----- n/a Patient verbalized understanding of instructions that were given at this phone interview. Patient denies shortness of breath, chest pain, fever, cough a this phone interview.   Anesthesia :    PCP:  Dr Riki Sheer (lov in epic) Cardiologist : no Chest x-ray : 05-28-2019 epic EKG :  05-28-2019 epic Echo : no Stress test:  no Cardiac Cath :  no Sleep Study/ CPAP : NO  Blood Thinner/ Instructions Maryjane Hurter Dose:  NO ASA / Instructions/ Last Dose :  NO

## 2019-06-18 ENCOUNTER — Other Ambulatory Visit: Payer: Self-pay

## 2019-06-18 ENCOUNTER — Ambulatory Visit (HOSPITAL_COMMUNITY): Payer: 59

## 2019-06-18 ENCOUNTER — Ambulatory Visit (HOSPITAL_BASED_OUTPATIENT_CLINIC_OR_DEPARTMENT_OTHER): Payer: 59 | Admitting: Anesthesiology

## 2019-06-18 ENCOUNTER — Ambulatory Visit (HOSPITAL_BASED_OUTPATIENT_CLINIC_OR_DEPARTMENT_OTHER)
Admission: RE | Admit: 2019-06-18 | Discharge: 2019-06-18 | Disposition: A | Discharge status: Home / Self Care | Payer: 59 | Source: Ambulatory Visit | Attending: Urology | Admitting: Urology

## 2019-06-18 ENCOUNTER — Encounter (HOSPITAL_BASED_OUTPATIENT_CLINIC_OR_DEPARTMENT_OTHER): Payer: Self-pay | Admitting: *Deleted

## 2019-06-18 ENCOUNTER — Encounter (HOSPITAL_BASED_OUTPATIENT_CLINIC_OR_DEPARTMENT_OTHER)
Admission: RE | Disposition: A | Discharge status: Home / Self Care | Payer: Self-pay | Source: Ambulatory Visit | Attending: Urology

## 2019-06-18 DIAGNOSIS — C61 Malignant neoplasm of prostate: Secondary | ICD-10-CM | POA: Insufficient documentation

## 2019-06-18 DIAGNOSIS — Z7951 Long term (current) use of inhaled steroids: Secondary | ICD-10-CM | POA: Diagnosis not present

## 2019-06-18 DIAGNOSIS — E785 Hyperlipidemia, unspecified: Secondary | ICD-10-CM | POA: Insufficient documentation

## 2019-06-18 DIAGNOSIS — I10 Essential (primary) hypertension: Secondary | ICD-10-CM | POA: Insufficient documentation

## 2019-06-18 DIAGNOSIS — Z79899 Other long term (current) drug therapy: Secondary | ICD-10-CM | POA: Insufficient documentation

## 2019-06-18 DIAGNOSIS — N403 Nodular prostate with lower urinary tract symptoms: Secondary | ICD-10-CM | POA: Diagnosis not present

## 2019-06-18 DIAGNOSIS — J449 Chronic obstructive pulmonary disease, unspecified: Secondary | ICD-10-CM | POA: Insufficient documentation

## 2019-06-18 DIAGNOSIS — Z8249 Family history of ischemic heart disease and other diseases of the circulatory system: Secondary | ICD-10-CM | POA: Insufficient documentation

## 2019-06-18 HISTORY — DX: Benign prostatic hyperplasia with lower urinary tract symptoms: N40.1

## 2019-06-18 HISTORY — PX: RADIOACTIVE SEED IMPLANT: SHX5150

## 2019-06-18 HISTORY — DX: Chronic obstructive pulmonary disease, unspecified: J44.9

## 2019-06-18 HISTORY — PX: CYSTOSCOPY: SHX5120

## 2019-06-18 HISTORY — PX: SPACE OAR INSTILLATION: SHX6769

## 2019-06-18 SURGERY — INSERTION, RADIATION SOURCE, PROSTATE
Anesthesia: General | Site: Bladder

## 2019-06-18 MED ORDER — CIPROFLOXACIN IN D5W 400 MG/200ML IV SOLN
400.0000 mg | INTRAVENOUS | Status: AC
  Administered 2019-06-18: 13:00:00 400 mg via INTRAVENOUS
  Filled 2019-06-18: qty 200

## 2019-06-18 MED ORDER — KETOROLAC TROMETHAMINE 30 MG/ML IJ SOLN
INTRAMUSCULAR | Status: AC
  Filled 2019-06-18: qty 1

## 2019-06-18 MED ORDER — ACETAMINOPHEN 500 MG PO TABS
1000.0000 mg | ORAL_TABLET | Freq: Once | ORAL | Status: AC
  Administered 2019-06-18: 1000 mg via ORAL
  Filled 2019-06-18: qty 2

## 2019-06-18 MED ORDER — GLYCOPYRROLATE PF 0.2 MG/ML IJ SOSY
PREFILLED_SYRINGE | INTRAMUSCULAR | Status: AC
  Filled 2019-06-18: qty 1

## 2019-06-18 MED ORDER — SODIUM CHLORIDE (PF) 0.9 % IJ SOLN
INTRAMUSCULAR | Status: DC | PRN
  Administered 2019-06-18: 10 mL via INTRAVENOUS

## 2019-06-18 MED ORDER — GLYCOPYRROLATE 0.2 MG/ML IJ SOLN
INTRAMUSCULAR | Status: DC | PRN
  Administered 2019-06-18: 0.2 mg via INTRAVENOUS

## 2019-06-18 MED ORDER — IOHEXOL 300 MG/ML  SOLN
INTRAMUSCULAR | Status: DC | PRN
  Administered 2019-06-18: 7 mL

## 2019-06-18 MED ORDER — STERILE WATER FOR INJECTION IJ SOLN
INTRAMUSCULAR | Status: DC | PRN
  Administered 2019-06-18: 3 mL

## 2019-06-18 MED ORDER — PROMETHAZINE HCL 25 MG/ML IJ SOLN
6.2500 mg | INTRAMUSCULAR | Status: DC | PRN
  Filled 2019-06-18: qty 1

## 2019-06-18 MED ORDER — LACTATED RINGERS IV SOLN
INTRAVENOUS | Status: DC
  Administered 2019-06-18 (×2): via INTRAVENOUS
  Filled 2019-06-18: qty 1000

## 2019-06-18 MED ORDER — PROPOFOL 10 MG/ML IV BOLUS
INTRAVENOUS | Status: AC
  Filled 2019-06-18: qty 20

## 2019-06-18 MED ORDER — LIDOCAINE 2% (20 MG/ML) 5 ML SYRINGE
INTRAMUSCULAR | Status: AC
  Filled 2019-06-18: qty 5

## 2019-06-18 MED ORDER — SODIUM CHLORIDE 0.9 % IR SOLN
Status: DC | PRN
  Administered 2019-06-18: 1000 mL via INTRAVESICAL

## 2019-06-18 MED ORDER — FENTANYL CITRATE (PF) 100 MCG/2ML IJ SOLN
INTRAMUSCULAR | Status: AC
  Filled 2019-06-18: qty 2

## 2019-06-18 MED ORDER — KETOROLAC TROMETHAMINE 30 MG/ML IJ SOLN
INTRAMUSCULAR | Status: DC | PRN
  Administered 2019-06-18: 30 mg via INTRAVENOUS

## 2019-06-18 MED ORDER — MIDAZOLAM HCL 5 MG/5ML IJ SOLN
INTRAMUSCULAR | Status: DC | PRN
  Administered 2019-06-18: 2 mg via INTRAVENOUS

## 2019-06-18 MED ORDER — DEXAMETHASONE SODIUM PHOSPHATE 4 MG/ML IJ SOLN
INTRAMUSCULAR | Status: DC | PRN
  Administered 2019-06-18: 5 mg via INTRAVENOUS

## 2019-06-18 MED ORDER — EPHEDRINE 5 MG/ML INJ
INTRAVENOUS | Status: AC
  Filled 2019-06-18: qty 10

## 2019-06-18 MED ORDER — FLEET ENEMA 7-19 GM/118ML RE ENEM
1.0000 | ENEMA | Freq: Once | RECTAL | Status: DC
  Filled 2019-06-18: qty 1

## 2019-06-18 MED ORDER — ACETAMINOPHEN 500 MG PO TABS
ORAL_TABLET | ORAL | Status: AC
  Filled 2019-06-18: qty 2

## 2019-06-18 MED ORDER — PROPOFOL 10 MG/ML IV BOLUS
INTRAVENOUS | Status: DC | PRN
  Administered 2019-06-18 (×2): 50 mg via INTRAVENOUS
  Administered 2019-06-18: 200 mg via INTRAVENOUS

## 2019-06-18 MED ORDER — LIDOCAINE HCL (CARDIAC) PF 100 MG/5ML IV SOSY
PREFILLED_SYRINGE | INTRAVENOUS | Status: DC | PRN
  Administered 2019-06-18: 100 mg via INTRAVENOUS

## 2019-06-18 MED ORDER — EPHEDRINE SULFATE 50 MG/ML IJ SOLN
INTRAMUSCULAR | Status: DC | PRN
  Administered 2019-06-18 (×4): 10 mg via INTRAVENOUS

## 2019-06-18 MED ORDER — SODIUM CHLORIDE 0.9% FLUSH
3.0000 mL | Freq: Two times a day (BID) | INTRAVENOUS | Status: DC
  Filled 2019-06-18: qty 3

## 2019-06-18 MED ORDER — MIDAZOLAM HCL 2 MG/2ML IJ SOLN
INTRAMUSCULAR | Status: AC
  Filled 2019-06-18: qty 2

## 2019-06-18 MED ORDER — OXYCODONE HCL 5 MG PO TABS
5.0000 mg | ORAL_TABLET | Freq: Once | ORAL | Status: DC | PRN
  Filled 2019-06-18: qty 1

## 2019-06-18 MED ORDER — ONDANSETRON HCL 4 MG/2ML IJ SOLN
INTRAMUSCULAR | Status: AC
  Filled 2019-06-18: qty 2

## 2019-06-18 MED ORDER — HYDROCODONE-ACETAMINOPHEN 5-325 MG PO TABS: 1.0000 | ORAL_TABLET | Freq: Four times a day (QID) | ORAL | 0 refills | Status: DC | PRN

## 2019-06-18 MED ORDER — CIPROFLOXACIN IN D5W 400 MG/200ML IV SOLN
INTRAVENOUS | Status: AC
  Filled 2019-06-18: qty 200

## 2019-06-18 MED ORDER — ONDANSETRON HCL 4 MG/2ML IJ SOLN
INTRAMUSCULAR | Status: DC | PRN
  Administered 2019-06-18: 4 mg via INTRAVENOUS

## 2019-06-18 MED ORDER — FENTANYL CITRATE (PF) 100 MCG/2ML IJ SOLN
25.0000 ug | INTRAMUSCULAR | Status: DC | PRN
  Filled 2019-06-18: qty 1

## 2019-06-18 MED ORDER — OXYCODONE HCL 5 MG/5ML PO SOLN
5.0000 mg | Freq: Once | ORAL | Status: DC | PRN
  Filled 2019-06-18: qty 5

## 2019-06-18 MED ORDER — FENTANYL CITRATE (PF) 100 MCG/2ML IJ SOLN
INTRAMUSCULAR | Status: DC | PRN
  Administered 2019-06-18 (×6): 25 ug via INTRAVENOUS
  Administered 2019-06-18: 50 ug via INTRAVENOUS

## 2019-06-18 MED ORDER — DEXAMETHASONE SODIUM PHOSPHATE 10 MG/ML IJ SOLN
INTRAMUSCULAR | Status: AC
  Filled 2019-06-18: qty 1

## 2019-06-18 SURGICAL SUPPLY — 42 items
BAG URINE DRAIN 2000ML AR STRL (UROLOGICAL SUPPLIES) ×4 IMPLANT
BLADE CLIPPER SENSICLIP SURGIC (BLADE) ×4 IMPLANT
CATH FOLEY 2WAY SLVR  5CC 16FR (CATHETERS) ×2
CATH FOLEY 2WAY SLVR 5CC 16FR (CATHETERS) ×2 IMPLANT
CATH ROBINSON RED A/P 16FR (CATHETERS) IMPLANT
CATH ROBINSON RED A/P 20FR (CATHETERS) ×4 IMPLANT
CLOTH BEACON ORANGE TIMEOUT ST (SAFETY) ×4 IMPLANT
CONT SPEC 4OZ CLIKSEAL STRL BL (MISCELLANEOUS) ×8 IMPLANT
COVER BACK TABLE 60X90IN (DRAPES) ×4 IMPLANT
COVER MAYO STAND STRL (DRAPES) ×4 IMPLANT
DRSG TEGADERM 4X4.75 (GAUZE/BANDAGES/DRESSINGS) ×4 IMPLANT
DRSG TEGADERM 8X12 (GAUZE/BANDAGES/DRESSINGS) ×4 IMPLANT
GAUZE SPONGE 4X4 12PLY STRL LF (GAUZE/BANDAGES/DRESSINGS) ×4 IMPLANT
GLOVE BIO SURGEON STRL SZ 6.5 (GLOVE) ×3 IMPLANT
GLOVE BIO SURGEON STRL SZ7.5 (GLOVE) IMPLANT
GLOVE BIO SURGEON STRL SZ8 (GLOVE) IMPLANT
GLOVE BIO SURGEONS STRL SZ 6.5 (GLOVE) ×1
GLOVE BIOGEL PI IND STRL 6.5 (GLOVE) ×2 IMPLANT
GLOVE BIOGEL PI INDICATOR 6.5 (GLOVE) ×2
GLOVE SURG ORTHO 8.5 STRL (GLOVE) ×4 IMPLANT
GLOVE SURG SS PI 6.5 STRL IVOR (GLOVE) ×4 IMPLANT
GLOVE SURG SS PI 8.0 STRL IVOR (GLOVE) ×8 IMPLANT
GOWN STRL REUS W/ TWL LRG LVL3 (GOWN DISPOSABLE) ×2 IMPLANT
GOWN STRL REUS W/ TWL XL LVL3 (GOWN DISPOSABLE) ×2 IMPLANT
GOWN STRL REUS W/TWL LRG LVL3 (GOWN DISPOSABLE) ×2
GOWN STRL REUS W/TWL XL LVL3 (GOWN DISPOSABLE) ×6 IMPLANT
HOLDER FOLEY CATH W/STRAP (MISCELLANEOUS) IMPLANT
I-Seed AgX100 ×252 IMPLANT
IMPL SPACEOAR SYSTEM 10ML (Spacer) ×2 IMPLANT
IMPLANT SPACEOAR SYSTEM 10ML (Spacer) ×4 IMPLANT
IV NS 1000ML (IV SOLUTION) ×2
IV NS 1000ML BAXH (IV SOLUTION) ×2 IMPLANT
KIT TURNOVER CYSTO (KITS) ×4 IMPLANT
MARKER SKIN DUAL TIP RULER LAB (MISCELLANEOUS) ×4 IMPLANT
PACK CYSTO (CUSTOM PROCEDURE TRAY) ×4 IMPLANT
SURGILUBE 2OZ TUBE FLIPTOP (MISCELLANEOUS) IMPLANT
SUT BONE WAX W31G (SUTURE) IMPLANT
SYR 10ML LL (SYRINGE) ×4 IMPLANT
TOWEL OR 17X26 10 PK STRL BLUE (TOWEL DISPOSABLE) ×4 IMPLANT
UNDERPAD 30X30 (UNDERPADS AND DIAPERS) ×8 IMPLANT
WATER STERILE IRR 3000ML UROMA (IV SOLUTION) IMPLANT
WATER STERILE IRR 500ML POUR (IV SOLUTION) ×4 IMPLANT

## 2019-06-18 NOTE — Anesthesia Postprocedure Evaluation (Signed)
Anesthesia Post Note  Patient: David [name]  Procedure(s) Performed: RADIOACTIVE SEED IMPLANT/BRACHYTHERAPY IMPLANT (N/A ) SPACE OAR INSTILLATION (N/A ) CYSTOSCOPY (N/A Bladder)     Patient location during evaluation: PACU Anesthesia Type: General Level of consciousness: awake and alert and oriented Pain management: pain level controlled Vital Signs Assessment: post-procedure vital signs reviewed and stable Respiratory status: spontaneous breathing, nonlabored ventilation and respiratory function stable Cardiovascular status: blood pressure returned to baseline Postop Assessment: no apparent nausea or vomiting Anesthetic complications: no    Last Vitals:  Vitals:   06/18/19 1353 06/18/19 1400  BP:  129/84  Pulse: 100 (!) 102  Resp: (!) 25 16  Temp:    SpO2: 100% 95%    Last Pain:  Vitals:   06/18/19 1353  TempSrc:   PainSc: 0-No pain                 Brennan Bailey

## 2019-06-18 NOTE — Anesthesia Procedure Notes (Signed)
Procedure Name: LMA Insertion Date/Time: 06/18/2019 12:23 PM Performed by: Justice Rocher, CRNA Pre-anesthesia Checklist: Patient identified, Emergency Drugs available, Suction available and Patient being monitored Patient Re-evaluated:Patient Re-evaluated prior to induction Oxygen Delivery Method: Circle system utilized Preoxygenation: Pre-oxygenation with 100% oxygen Induction Type: IV induction Ventilation: Mask ventilation without difficulty LMA: LMA inserted LMA Size: 5.0 Number of attempts: 1 Airway Equipment and Method: Bite block Placement Confirmation: positive ETCO2 and breath sounds checked- equal and bilateral Tube secured with: Tape Dental Injury: Teeth and Oropharynx as per pre-operative assessment

## 2019-06-18 NOTE — Interval H&P Note (Signed)
No change in history

## 2019-06-18 NOTE — Transfer of Care (Signed)
Immediate Anesthesia Transfer of Care Note  Patient: David [name]  Procedure(s) Performed: Procedure(s) (LRB): RADIOACTIVE SEED IMPLANT/BRACHYTHERAPY IMPLANT (N/A) SPACE OAR INSTILLATION (N/A) CYSTOSCOPY (N/A)  Patient Location: PACU  Anesthesia Type: General  Level of Consciousness: awake, sedated, patient cooperative and responds to stimulation  Airway & Oxygen Therapy: Patient Spontanous Breathing and Patient connected to NC02 and soft FM  Post-op Assessment: Report given to PACU RN, Post -op Vital signs reviewed and stable and Patient moving all extremities  Post vital signs: Reviewed and stable  Complications: No apparent anesthesia complications

## 2019-06-18 NOTE — Discharge Instructions (Signed)
Brachytherapy for Prostate Cancer, Care After ° °This sheet gives you information about how to care for yourself after your procedure. Your health care provider may also give you more specific instructions. If you have problems or questions, contact your health care provider. °What can I expect after the procedure? °After the procedure, it is common to have: °· Trouble passing urine. °· Blood in the urine or semen. °· Constipation. °· Frequent feeling of an urgent need to urinate. °· Bruising, swelling, and tenderness of the area behind the scrotum (perineum). °· Bloating and gas. °· Fatigue. °· Burning or pain in the rectum. °· Problems getting or keeping an erection (erectile dysfunction). °· Nausea. °Follow these instructions at home: °Managing pain, stiffness, and swelling °· If directed, apply ice to the affected area: °? Put ice in a plastic bag. °? Place a towel between your skin and the bag. °? Leave the ice on for 20 minutes, 2-3 times a day. °· Try not to sit directly on the area behind the scrotum. A soft cushion can help with discomfort. °Activity °· Do not drive for 24 hours if you were given a medicine to help you relax (sedative). °· Do not drive or use heavy machinery while taking prescription pain medicine. °· Rest as told by your health care provider. °· Most people can return to normal activities a few days or weeks after the procedure. Ask your health care provider what activities are safe for you. °Eating and drinking °· Drink enough fluid to keep your urine clear or pale yellow. °· Eat a healthy, balanced diet. This includes lean proteins, whole grains, and plenty of fruits and vegetables. °General instructions °· Take over-the-counter and prescription medicines only as told by your health care provider. °· Keep all follow-up visits as told by your health care provider. This is important. You may still need additional treatment. °· Do not take baths, swim, or use a hot tub until your health  care provider approves. Shower and wash the area behind the scrotum gently. °· Do not have sex for one week after the treatment, or until your health care provider approves. °· If you have permanent, low-dose brachytherapy implants: °? Limit close contact with children and pregnant women for 2 months or as told by your health care provider. This is important because of the radiation that is still active in the prostate. °? You may set off radioactive sensors, such as airport screenings. Ask your health care provider for a document that explains your treatment. °? You may be instructed to use a condom during sex for the first 2 months after low-dose brachytherapy. °Contact a health care provider if: °· You have a fever or chills. °· You do not have a bowel movement for 3-4 days after the procedure. °· You have diarrhea for 3-4 days after the procedure. °· You develop any new symptoms, such as problems with urinating or erectile dysfunction. °· You have abdomen (abdominal) pain. °· You have more blood in your urine. °Get help right away if: °· You cannot urinate. °· There is excessive bleeding from your rectum. °· You have unusual drainage coming from your rectum. °· You have severe pain in the treated area that does not go away with pain medicine. °· You have severe nausea or vomiting. °Summary °· If you have permanent, low-dose brachytherapy implants, limit close contact with children and pregnant women for 2 months or as told by your health care provider. This is important because of the radiation   that is still active in the prostate. °· Talk with your health care provider about your risk of brachytherapy side effects, such as erectile dysfunction or urinary problems. Your health care provider will be able to recommend possible treatment options. °· Keep all follow-up visits as told by your health care provider. This is important. You may need additional treatment. °This information is not intended to replace  advice given to you by your health care provider. Make sure you discuss any questions you have with your health care provider. °Document Released: 08/18/2010 Document Revised: 06/28/2017 Document Reviewed: 08/17/2016 °Elsevier Patient Education © 2020 [employer] ° ° ° ° °Post Anesthesia Home Care Instructions ° °Activity: °Get plenty of rest for the remainder of the day. A responsible individual must stay with you for 24 hours following the procedure.  °For the next 24 hours, DO NOT: °-Drive a car °-Operate machinery °-Drink alcoholic beverages °-Take any medication unless instructed by your physician °-Make any legal decisions or sign important papers. ° °Meals: °Start with liquid foods such as gelatin or soup. Progress to regular foods as tolerated. Avoid greasy, spicy, heavy foods. If nausea and/or vomiting occur, drink only clear liquids until the nausea and/or vomiting subsides. Call your physician if vomiting continues. ° °Special Instructions/Symptoms: °Your throat may feel dry or sore from the anesthesia or the breathing tube placed in your throat during surgery. If this causes discomfort, gargle with warm salt water. The discomfort should disappear within 24 hours. ° °If you had a scopolamine patch placed behind your ear for the management of post- operative nausea and/or vomiting: ° °1. The medication in the patch is effective for 72 hours, after which it should be removed.  Wrap patch in a tissue and discard in the trash. Wash hands thoroughly with soap and water. °2. You may remove the patch earlier than 72 hours if you experience unpleasant side effects which may include dry mouth, dizziness or visual disturbances. °3. Avoid touching the patch. Wash your hands with soap and water after contact with the patch. °   ° ° °

## 2019-06-18 NOTE — Op Note (Signed)
PATIENT:  David [name]  PRE-OPERATIVE DIAGNOSIS:  Adenocarcinoma of the prostate  POST-OPERATIVE DIAGNOSIS:  Same  PROCEDURE:  Procedure(s): 1. I-125 radioactive seed implantation 2. SpaceOAR implantation. 3.  Cystoscopy  SURGEON:  Surgeon(s): Irine Seal MD  Radiation oncologist: Dr. Tyler Pita  ANESTHESIA:  General  EBL:  Minimal  DRAINS: 48 French Foley catheter  INDICATION: [name] is a 58 y.o. with Stage T2a, Gleason 7(3+4) prostate cancer who has elected brachytherapy for treatment.  Description of procedure: After informed consent the patient was brought to the major OR, placed on the table and administered general anesthesia. Cipro was infused. He was then moved to the modified lithotomy position with his perineum perpendicular to the floor. His perineum and genitalia were then sterilely prepped. An official timeout was then performed. A 16 French Foley catheter was then placed in the bladder and filled with dilute contrast, a rectal tube was placed in the rectum and the transrectal ultrasound probe was placed in the rectum and affixed to the stand. He was then sterilely draped.  The sterile grid was installed.   Anchor needles were then placed.   Real time ultrasonography was used along with the seed planning software spot-pro version 3.1-00. This was used to develop the seed plan including the number of needles as well as number of seeds required for complete and adequate coverage. Real-time ultrasonography was then used along with the previously developed plan  to implant a total of 63 seeds using 18 needles for a target dose of 145 Gy. This proceeded without difficulty or complication.  The anchor needles and guide were removed and the SpaceOAR needle was passed under US guidance into the fat stripe posterior to the prostate with the tip in the midline at mid prostate. A puff of NS confirmed appropriate positioning and the SpaceOAR polymer was then injected over 10  seconds into the space with excellent distribution.     A Foley catheter was then removed as well as the transrectal ultrasound probe and rectal probe. Flexible cystoscopy was then performed using the 17 French flexible scope which revealed a normal urethra throughout its length down to the sphincter which appeared intact. The prostatic urethra was 4cm with trilobar hyperplasia with a moderate intravesical middle lobe. The bladder was then entered and fully and systematically inspected.  The ureteral orifices were noted to be of normal configuration and position. There was mild trabeculation. The mucosa revealed no evidence of tumors.   There were also no stones identified within the bladder.  No seeds or spacers were seen and/or removed from the bladder.  The cystoscope was then removed.  The drapes were removed.  The perineum was cleaned and dressed.  He was taken out of the lithotomy position and was awakened and taken to recovery room in stable and satisfactory condition. He tolerated procedure well and there were no intraoperative complications.

## 2019-06-18 NOTE — Anesthesia Preprocedure Evaluation (Addendum)
Anesthesia Evaluation  Patient identified by MRN, date of birth, ID band Patient awake    Reviewed: Allergy & Precautions, NPO status , Patient's Chart, lab work & pertinent test results  History of Anesthesia Complications Negative for: history of anesthetic complications  Airway Mallampati: II  TM Distance: >3 FB Neck ROM: Full    Dental no notable dental hx.    Pulmonary COPD,    Pulmonary exam normal        Cardiovascular hypertension, Pt. on medications Normal cardiovascular exam     Neuro/Psych negative neurological ROS  negative psych ROS   GI/Hepatic negative GI ROS, Neg liver ROS,   Endo/Other  negative endocrine ROS  Renal/GU negative Renal ROS  negative genitourinary   Musculoskeletal negative musculoskeletal ROS (+)   Abdominal   Peds  Hematology negative hematology ROS (+)   Anesthesia Other Findings Day of surgery medications reviewed with patient.  Reproductive/Obstetrics negative OB ROS                            Anesthesia Physical Anesthesia Plan  ASA: II  Anesthesia Plan: General   Post-op Pain Management:    Induction: Intravenous  PONV Risk Score and Plan: 3 and Treatment may vary due to age or medical condition, Ondansetron, Dexamethasone and Midazolam  Airway Management Planned: LMA  Additional Equipment: None  Intra-op Plan:   Post-operative Plan: Extubation in OR  Informed Consent: I have reviewed the patients History and Physical, chart, labs and discussed the procedure including the risks, benefits and alternatives for the proposed anesthesia with the patient or authorized representative who has indicated his/her understanding and acceptance.     Dental advisory given  Plan Discussed with: CRNA  Anesthesia Plan Comments:        Anesthesia Quick Evaluation

## 2019-06-19 ENCOUNTER — Encounter (HOSPITAL_BASED_OUTPATIENT_CLINIC_OR_DEPARTMENT_OTHER): Payer: Self-pay | Admitting: Urology

## 2019-06-19 NOTE — Addendum Note (Signed)
Addendum  created 06/19/19 1215 by Wanita Chamberlain, CRNA   Intraprocedure Event edited

## 2019-06-21 NOTE — Progress Notes (Signed)
  Radiation Oncology         [phone] ________________________________  Name: Oris Cusato MRN: [id number]  Date: 06/21/2019  DOB: 05/19/1961       Prostate Seed Implant  [id number], Crosby Oyster, DO  No ref. provider found  DIAGNOSIS: 58 y.o. gentleman with Stage T2a adenocarcinoma of the prostate with Gleason score of 3+4, and PSA of 6.39.   PROCEDURE: Insertion of radioactive I-125 seeds into the prostate gland.  RADIATION DOSE: 145 Gy, definitive therapy.  TECHNIQUE: Bryker Pulver was brought to the operating room with the urologist. He was placed in the dorsolithotomy position. He was catheterized and a rectal tube was inserted. The perineum was shaved, prepped and draped. The ultrasound probe was then introduced into the rectum to see the prostate gland.  TREATMENT DEVICE: A needle grid was attached to the ultrasound probe stand and anchor needles were placed.  3D PLANNING: The prostate was imaged in 3D using a sagittal sweep of the prostate probe. These images were transferred to the planning computer. There, the prostate, urethra and rectum were defined on each axial reconstructed image. Then, the software created an optimized 3D plan and a few seed positions were adjusted. The quality of the plan was reviewed using [facility] information for the target and the following two organs at risk:  Urethra and Rectum.  Then the accepted plan was printed and handed off to the radiation therapist.  Under my supervision, the custom loading of the seeds and spacers was carried out and loaded into sealed vicryl sleeves.  These pre-loaded needles were then placed into the needle holder.Marland Kitchen  PROSTATE VOLUME STUDY:  Using transrectal ultrasound the volume of the prostate was verified to be 64.8 cc.  SPECIAL TREATMENT PROCEDURE/SUPERVISION AND HANDLING: The pre-loaded needles were then delivered under sagittal guidance. A total of 18 needles were used to deposit 63 seeds in the prostate gland. The  individual seed activity was 0.678 mCi.  SpaceOAR:  Yes  COMPLEX SIMULATION: At the end of the procedure, an anterior radiograph of the pelvis was obtained to document seed positioning and count. Cystoscopy was performed to check the urethra and bladder.  MICRODOSIMETRY: At the end of the procedure, the patient was emitting 0.08 mR/hr at 1 meter. Accordingly, he was considered safe for hospital discharge.  PLAN: The patient will return to the radiation oncology clinic for post implant CT dosimetry in three weeks.   ________________________________  Sheral Apley Tammi Klippel, M.D.

## 2019-06-22 ENCOUNTER — Encounter (HOSPITAL_BASED_OUTPATIENT_CLINIC_OR_DEPARTMENT_OTHER): Payer: Self-pay | Admitting: Urology

## 2019-06-22 ENCOUNTER — Telehealth: Payer: Self-pay | Admitting: Medical Oncology

## 2019-06-22 NOTE — Addendum Note (Signed)
Addendum  created 06/22/19 1220 by Justice Rocher, CRNA   Intraprocedure Event deleted, Intraprocedure Event edited, Intraprocedure Meds edited, Intraprocedure Staff edited

## 2019-06-23 NOTE — Telephone Encounter (Signed)
Returning a call to patient, left message requesting a return call.

## 2019-07-07 ENCOUNTER — Telehealth: Payer: Self-pay | Admitting: *Deleted

## 2019-07-07 NOTE — Telephone Encounter (Signed)
CALLED PATIENT TO INFORM THAT POST SEED APPTS. HAVE BEEN MOVED DUE TO NOT BEING ABLE TO GET MRI UNTIL 07-13-19, LVM FOR A RETURN CALL

## 2019-07-09 ENCOUNTER — Ambulatory Visit: Admission: RE | Admit: 2019-07-09 | Payer: 59 | Source: Ambulatory Visit | Admitting: Radiation Oncology

## 2019-07-10 ENCOUNTER — Ambulatory Visit: Payer: 59 | Admitting: Urology

## 2019-07-13 ENCOUNTER — Other Ambulatory Visit: Payer: Self-pay

## 2019-07-13 ENCOUNTER — Ambulatory Visit (HOSPITAL_COMMUNITY)
Admission: RE | Admit: 2019-07-13 | Discharge: 2019-07-13 | Disposition: A | Discharge status: Home / Self Care | Payer: 59 | Source: Ambulatory Visit | Attending: Urology | Admitting: Urology

## 2019-07-13 ENCOUNTER — Telehealth: Payer: Self-pay | Admitting: *Deleted

## 2019-07-13 DIAGNOSIS — C61 Malignant neoplasm of prostate: Secondary | ICD-10-CM

## 2019-07-13 NOTE — Telephone Encounter (Signed)
Returned patient's phone call, lvm 

## 2019-07-14 ENCOUNTER — Telehealth: Payer: Self-pay | Admitting: *Deleted

## 2019-07-14 NOTE — Telephone Encounter (Signed)
CALLED PATIENT TO REMIND OF POST SEED APPTS. FOR 07-15-19, LVM FOR A RETURN CALL

## 2019-07-15 ENCOUNTER — Other Ambulatory Visit: Payer: Self-pay

## 2019-07-15 ENCOUNTER — Ambulatory Visit
Admission: RE | Admit: 2019-07-15 | Discharge: 2019-07-15 | Disposition: A | Discharge status: Home / Self Care | Payer: 59 | Source: Ambulatory Visit | Attending: Urology | Admitting: Urology

## 2019-07-15 ENCOUNTER — Encounter: Payer: Self-pay | Admitting: Urology

## 2019-07-15 VITALS — BP 131/82 | HR 87 | Temp 98.2°F | Resp 20 | Ht 70.0 in | Wt 238.0 lb

## 2019-07-15 DIAGNOSIS — R350 Frequency of micturition: Secondary | ICD-10-CM | POA: Insufficient documentation

## 2019-07-15 DIAGNOSIS — Z923 Personal history of irradiation: Secondary | ICD-10-CM | POA: Insufficient documentation

## 2019-07-15 DIAGNOSIS — R3911 Hesitancy of micturition: Secondary | ICD-10-CM | POA: Insufficient documentation

## 2019-07-15 DIAGNOSIS — Z79899 Other long term (current) drug therapy: Secondary | ICD-10-CM | POA: Insufficient documentation

## 2019-07-15 DIAGNOSIS — C61 Malignant neoplasm of prostate: Secondary | ICD-10-CM | POA: Diagnosis present

## 2019-07-15 NOTE — Progress Notes (Signed)
Radiation Oncology         [phone] ________________________________  Name: David [name] MRN: [id number]  Date: 07/15/2019  DOB: 09/24/1960  Post-Seed Follow-Up Visit Note  CC: Shelda Pal, DO  Irine Seal, MD  Diagnosis:   58 y.o. gentleman with Stage T2a adenocarcinoma of the prostate with Gleason score of 3+4, and PSA of 6.39.    ICD-10-CM   1. Malignant neoplasm of prostate (HCC)  C61     Interval Since Last Radiation:  1 months 06/18/19:  Insertion of radioactive I-125 seeds into the prostate gland; 145 Gy, definitive/boost therapy with placement of SpaceOAR gel.  Narrative:  The patient returns today for routine follow-up.  He is complaining of increased urinary frequency and urinary hesitation symptoms. He filled out a questionnaire regarding urinary function today providing and overall IPSS score of 8 characterizing his symptoms as mild-moderate with nocturia x2, intermittency and mild increased frequency/urgency. He has continued on Avodart, started in 02/2019 to downsize the prostate prior to brachytherapy. He tolerates this medication well and feels that it has improved his FOS. His pre-implant score was 5. He denies any dysuria, incontinence, abdominal pain or bowel symptoms. Overall, he is quite pleased with his progress to date.  ALLERGIES:  has No Known Allergies.  Meds: Current Outpatient Medications  Medication Sig Dispense Refill  . budesonide-formoterol (SYMBICORT) 80-4.5 MCG/ACT inhaler Inhale 2 puffs into the lungs 2 (two) times daily. (Patient taking differently: Inhale 2 puffs into the lungs 2 (two) times daily as needed. ) 10.2 g 1  . dutasteride (AVODART) 0.5 MG capsule Take 0.5 mg by mouth daily.    Marland Kitchen HYDROcodone-acetaminophen (NORCO) 5-325 MG tablet Take 1 tablet by mouth every 6 (six) hours as needed for moderate pain. 6 tablet 0  . losartan-hydrochlorothiazide (HYZAAR) 100-12.5 MG tablet TAKE 1 TABLET BY MOUTH DAILY (Patient taking  differently: Take 1 tablet by mouth daily. ) 90 tablet 3  . sildenafil (VIAGRA) 100 MG tablet take half to one tablet by mouth daily as needed for erectile dysfunction  (Patient taking differently: Take 100 mg by mouth as needed. ) 30 tablet 0  . simvastatin (ZOCOR) 20 MG tablet TAKE 1 TABLET(20 MG) BY MOUTH DAILY (Patient taking differently: Take 20 mg by mouth daily. TAKE 1 TABLET(20 MG) BY MOUTH DAILY) 90 tablet 3   No current facility-administered medications for this visit.    Physical Findings: In general this is a well appearing African American male in no acute distress. He's alert and oriented x4 and appropriate throughout the examination. Cardiopulmonary assessment is negative for acute distress and he exhibits normal effort.   Lab Findings: Lab Results  Component Value Date   WBC 5.6 06/15/2019   HGB 15.3 06/15/2019   HCT 46.9 06/15/2019   MCV 84.2 06/15/2019   PLT 243 06/15/2019    Radiographic Findings:  Patient underwent CT imaging in our clinic for post implant dosimetry. The CT will be reviewed by Dr. Tammi Klippel to confirm there is an adequate distribution of radioactive seeds throughout the prostate gland and ensure that there are no seeds in or near the rectum. He had a prostate MRI on 07/13/19 and those images will be fused with his CT images for further evaluation. We suspect the final radiation plan and dosimetry will show appropriate coverage of the prostate gland. He understands that we will call and inform him of any unexpected findings on further review of his imaging and dosimetry.  Impression/Plan: 58 y.o. gentleman with Stage T2a  adenocarcinoma of the prostate with Gleason score of 3+4, and PSA of 6.39. The patient is recovering from the effects of radiation. His urinary symptoms should gradually improve over the next 4-6 months. We talked about this today. He is encouraged by his improvement already and is otherwise pleased with his outcome. We also talked about  long-term follow-up for prostate cancer following seed implant. He understands that ongoing PSA determinations and digital rectal exams will help perform surveillance to rule out disease recurrence. He does not currently have a follow up appointment scheduled with Dr. Jeffie Pollock ut anticipates a visit with repeat PSA in 08/2019 and plans to continue the Avodart medication until that time. He understands what to expect with his PSA measures. Patient was also educated today about some of the long-term effects from radiation including a small risk for rectal bleeding and possibly erectile dysfunction. We talked about some of the general management approaches to these potential complications. However, I did encourage the patient to contact our office or return at any point if he has questions or concerns related to his previous radiation and prostate cancer.    Nicholos Johns, PA-C

## 2019-07-15 NOTE — Progress Notes (Signed)
Mr Hebeler is here today for a  Follow-up appointment . Patient had an MRI o 07/13/2019. Patient states that he seen his urologist three weeks ago. Patient reports  Dysuria None Hematuria None Nocturia  x2 Empties his bladder Most of the time Stream strong Leakage none Urgency Sometime Bowels  None Vitals:   07/15/19 0833  BP: 131/82  Pulse: 87  Resp: 20  Temp: 98.2 F (36.8 C)  SpO2: 100%  Weight: 238 lb (108 kg)  Height: 5\' 10"  (1.778 m)

## 2019-07-19 NOTE — Addendum Note (Signed)
Encounter addended by: Tyler Pita, MD on: 07/19/2019 3:56 PM  Actions taken: Medication List reviewed, Problem List reviewed, Allergies reviewed, Clinical Note Signed

## 2019-07-19 NOTE — Progress Notes (Signed)
  Radiation Oncology         ([phone] ________________________________  Name: David [name] MRN: [id number]  Date: 07/15/2019  DOB: [birth year]  COMPLEX SIMULATION NOTE  NARRATIVE:  The patient was brought to the [facility] today following prostate seed implantation approximately one month ago.  Identity was confirmed.  All relevant records and images related to the planned course of therapy were reviewed.  Then, the patient was set-up supine.  CT images were obtained.  The CT images were loaded into the planning software.  Then the prostate and rectum were contoured.  Treatment planning then occurred.  The implanted iodine 125 seeds were identified by the physics staff for projection of radiation distribution  I have requested : 3D Simulation  I have requested a DVH of the following structures: Prostate and rectum.    ________________________________  Sheral Apley Tammi Klippel, M.D.

## 2019-07-21 ENCOUNTER — Encounter: Payer: Self-pay | Admitting: Radiation Oncology

## 2019-07-21 DIAGNOSIS — C61 Malignant neoplasm of prostate: Secondary | ICD-10-CM | POA: Diagnosis not present

## 2019-07-30 NOTE — Progress Notes (Addendum)
  Radiation Oncology         (336) [phone] ________________________________  Name: David [name] MRN: [id number]  Date: 07/21/2019  DOB: 1961-07-15  3D Planning Note   Prostate Brachytherapy Post-Implant Dosimetry  Diagnosis: 58 y.o. gentleman with Stage T2a adenocarcinoma of the prostate with Gleason score of 3+4, and PSA of 6.39.   Narrative: On a previous date, David [name] returned following prostate seed implantation for post implant planning. He underwent CT scan complex simulation to delineate the three-dimensional structures of the pelvis and demonstrate the radiation distribution.  Since that time, the seed localization, and complex isodose planning with dose volume histograms have now been completed.  Results:   Prostate Coverage - The dose of radiation delivered to the 90% or more of the prostate gland (D90) was 88.79% of the prescription dose. This is close to our goal of greater than 90%. Rectal Sparing - The volume of rectal tissue receiving the prescription dose or higher was 0.03 cc. This falls under our thresholds tolerance of 1.0 cc.  Impression: The prostate seed implant appears to show adequate target coverage and appropriate rectal sparing.  Plan:  The patient will continue to follow with urology for ongoing PSA determinations. I would anticipate a high likelihood for local tumor control with minimal risk for rectal morbidity.  ________________________________  Sheral Apley Tammi Klippel, M.D.

## 2019-09-18 ENCOUNTER — Telehealth: Payer: Self-pay | Admitting: Family Medicine

## 2019-09-18 DIAGNOSIS — J40 Bronchitis, not specified as acute or chronic: Secondary | ICD-10-CM

## 2019-09-18 MED ORDER — BUDESONIDE-FORMOTEROL FUMARATE 80-4.5 MCG/ACT IN AERO: 2.0000 | INHALATION_SPRAY | Freq: Two times a day (BID) | RESPIRATORY_TRACT | Status: DC

## 2019-09-18 NOTE — Telephone Encounter (Signed)
Gave one refill/with message to schedule OV for further refills. Left detailed message

## 2019-09-18 NOTE — Telephone Encounter (Signed)
Medication: budesonide-formoterol (SYMBICORT) 80-4.5 MCG/ACT inhaler    Has the patient contacted their pharmacy? No. (If no, request that the patient contact the pharmacy for the refill.) (If yes, when and what did the pharmacy advise?)  Preferred Pharmacy (with phone number or street name)   [facility], [place] - [address]  [address] [place], [place] [address]  Phone:  [phone] Fax:  [phone]  DEA #:  [id number]    Agent: Please be advised that RX refills may take up to 3 business days. We ask that you follow-up with your pharmacy.

## 2019-11-13 ENCOUNTER — Other Ambulatory Visit: Payer: Self-pay | Admitting: Family Medicine

## 2019-11-13 DIAGNOSIS — J40 Bronchitis, not specified as acute or chronic: Secondary | ICD-10-CM

## 2019-11-13 MED ORDER — BUDESONIDE-FORMOTEROL FUMARATE 80-4.5 MCG/ACT IN AERO: 2.0000 | INHALATION_SPRAY | Freq: Two times a day (BID) | RESPIRATORY_TRACT | Status: DC

## 2019-11-25 ENCOUNTER — Telehealth: Payer: Self-pay | Admitting: Family Medicine

## 2019-11-25 DIAGNOSIS — J40 Bronchitis, not specified as acute or chronic: Secondary | ICD-10-CM

## 2019-11-25 MED ORDER — BUDESONIDE-FORMOTEROL FUMARATE 80-4.5 MCG/ACT IN AERO: 2.0000 | INHALATION_SPRAY | Freq: Two times a day (BID) | RESPIRATORY_TRACT | 0 refills | Status: DC

## 2019-11-25 NOTE — Telephone Encounter (Signed)
Medication:budesonide-formoterol (SYMBICORT) 80-4.5 MCG/ACT inhaler [id number]    Has the patient contacted their pharmacy? No. (If no, request that the patient contact the pharmacy for the refill.) (If yes, when and what did the pharmacy advise?)  Preferred Pharmacy (with phone number or street name): [facility], [place] - [address]  [place] [address]  Phone:  [phone] Fax:  [phone]  DEA #:  [id number]  Agent: Please be advised that RX refills may take up to 3 business days. We ask that you follow-up with your pharmacy.

## 2019-11-27 ENCOUNTER — Other Ambulatory Visit: Payer: Self-pay

## 2019-11-27 ENCOUNTER — Encounter: Payer: Self-pay | Admitting: Family Medicine

## 2019-11-27 ENCOUNTER — Ambulatory Visit (INDEPENDENT_AMBULATORY_CARE_PROVIDER_SITE_OTHER): Payer: 59 | Admitting: Family Medicine

## 2019-11-27 VITALS — BP 132/84 | HR 94 | Temp 97.3°F | Ht 71.0 in | Wt 242.5 lb

## 2019-11-27 DIAGNOSIS — E785 Hyperlipidemia, unspecified: Secondary | ICD-10-CM | POA: Diagnosis not present

## 2019-11-27 DIAGNOSIS — I10 Essential (primary) hypertension: Secondary | ICD-10-CM | POA: Diagnosis not present

## 2019-11-27 DIAGNOSIS — J45909 Unspecified asthma, uncomplicated: Secondary | ICD-10-CM | POA: Insufficient documentation

## 2019-11-27 DIAGNOSIS — J4540 Moderate persistent asthma, uncomplicated: Secondary | ICD-10-CM

## 2019-11-27 LAB — LIPID PANEL
Cholesterol: 166 mg/dL (ref 0–200)
HDL: 40.1 mg/dL (ref >39.00)
LDL Cholesterol: 106 mg/dL — ABNORMAL HIGH (ref 0–99)
NonHDL: 125.77
Total CHOL/HDL Ratio: 4
Triglycerides: 101 mg/dL (ref 0.0–149.0)
VLDL: 20.2 mg/dL (ref 0.0–40.0)

## 2019-11-27 LAB — COMPREHENSIVE METABOLIC PANEL
ALT: 17 U/L (ref 0–53)
AST: 23 U/L (ref 0–37)
Albumin: 4.8 g/dL (ref 3.5–5.2)
Alkaline Phosphatase: 74 U/L (ref 39–117)
BUN: 14 mg/dL (ref 6–23)
CO2: 31 mEq/L (ref 19–32)
Calcium: 9.5 mg/dL (ref 8.4–10.5)
Chloride: 100 mEq/L (ref 96–112)
Creatinine, Ser: 1.18 mg/dL (ref 0.40–1.50)
GFR: 77.09 mL/min (ref >60.00)
Glucose, Bld: 94 mg/dL (ref 70–99)
Potassium: 3.8 mEq/L (ref 3.5–5.1)
Sodium: 138 mEq/L (ref 135–145)
Total Bilirubin: 1.2 mg/dL (ref 0.2–1.2)
Total Protein: 7.2 g/dL (ref 6.0–8.3)

## 2019-11-27 MED ORDER — LOSARTAN POTASSIUM-HCTZ 100-12.5 MG PO TABS: 1.0000 | ORAL_TABLET | Freq: Every day | ORAL | 2 refills | Status: DC

## 2019-11-27 MED ORDER — BUDESONIDE-FORMOTEROL FUMARATE 80-4.5 MCG/ACT IN AERO: 2.0000 | INHALATION_SPRAY | Freq: Two times a day (BID) | RESPIRATORY_TRACT | 10 refills | Status: DC

## 2019-11-27 MED ORDER — SIMVASTATIN 20 MG PO TABS: 20.0000 mg | ORAL_TABLET | Freq: Every day | ORAL | 2 refills | Status: DC

## 2019-11-27 NOTE — Progress Notes (Signed)
Chief Complaint  Patient presents with  . Follow-up    Subjective David [name] is a 59 y.o. male who presents for hypertension follow up. He does not monitor home blood pressures. He is compliant with medications- Hyzaar 100-12.5 mg/d. Patient has these side effects of medication: none He is usually adhering to a healthy diet overall. Current exercise: walking   Hyperlipidemia Patient presents for dyslipidemia follow up. Currently being treated with Zocor 20 mg/d and compliance with treatment thus far has been good. He denies myalgias. Diet/exercise as above.  The patient is not known to have coexisting coronary artery disease.  +hx of allergy induced asthma. Takes Symbicort prn. Does not rinse mouth out. Helps with wheezing and sob. Does not need qd or all throughout year. No smoking.   Past Medical History:  Diagnosis Date  . COPD (chronic obstructive pulmonary disease) ([place])    (06-17-2019 per pt's pcp, dr Nani Ravens ,  dx copd unspecified type 08-17-2016 note in epic PFTs showed some obstruction per note)  . Hyperlipidemia   . Hyperplasia of prostate with lower urinary tract symptoms ([facility])   . Hypertension    followed by pcp (06-17-2019 per pt never had stress test)  . Prostate cancer [facility]) urologist-- dr wrenn/  oncologist-- dr Tammi Klippel   dx 12-24-2018-- Stage T2a, Gleason 3+4  . Seasonal allergies     Review of Systems Cardiovascular: no chest pain Respiratory:  no shortness of breath  Exam BP 132/84 (BP Location: Left Arm, Patient Position: Sitting, Cuff Size: Large)   Pulse 94   Temp (!) 97.3 F (36.3 C) (Temporal)   Ht 5\' 11"  (1.803 m)   Wt 242 lb 8 oz (110 kg)   SpO2 95%   BMI 33.82 kg/m  General:  well developed, well nourished, in no apparent distress Heart: RRR, no bruits, no LE edema Lungs: clear to auscultation, no accessory muscle use Psych: well oriented with normal range of affect and appropriate judgment/insight  Essential hypertension, benign  - Plan: losartan-hydrochlorothiazide (HYZAAR) 100-12.5 MG tablet  Hyperlipidemia, unspecified hyperlipidemia type - Plan: simvastatin (ZOCOR) 20 MG tablet  Moderate persistent extrinsic asthma without complication - Plan: budesonide-formoterol (SYMBICORT) 80-4.5 MCG/ACT inhaler  1- Chronic, stable; Cont Hyzaar. Does not need to monitor BP at home. Counseled on diet and exercise. 2- Chronic, stable, Cont statin, Ck lipids.  3- Rinse mouth out, cont ICS/LABA prn.  F/u in 6 mo for CPE. The patient voiced understanding and agreement to the plan.  [name], DO 11/27/19  1:55 PM

## 2019-11-27 NOTE — Patient Instructions (Signed)
Aim to do some physical exertion for 150 minutes per week. This is typically divided into 5 days per week, 30 minutes per day. The activity should be enough to get your heart rate up. Anything is better than nothing if you have time constraints.  Give [place] 2-3 business days to get the results of your labs back.  Keep the diet clean and stay active.  Rinse your mouth out after you use your inhaler.   Let us know if you need anything.

## 2020-01-18 ENCOUNTER — Other Ambulatory Visit: Payer: Self-pay

## 2020-01-18 DIAGNOSIS — E785 Hyperlipidemia, unspecified: Secondary | ICD-10-CM

## 2020-01-18 DIAGNOSIS — J4540 Moderate persistent asthma, uncomplicated: Secondary | ICD-10-CM

## 2020-01-18 MED ORDER — BUDESONIDE-FORMOTEROL FUMARATE 80-4.5 MCG/ACT IN AERO: 2.0000 | INHALATION_SPRAY | Freq: Two times a day (BID) | RESPIRATORY_TRACT | 10 refills | Status: DC

## 2020-01-18 MED ORDER — SIMVASTATIN 20 MG PO TABS: 20.0000 mg | ORAL_TABLET | Freq: Every day | ORAL | 1 refills | Status: DC

## 2020-01-18 NOTE — Telephone Encounter (Signed)
Patient notified rxs have been sent to pharmacy

## 2020-01-18 NOTE — Telephone Encounter (Signed)
Patient called in to get a prescription refill for budesonide-formoterol (SYMBICORT) 80-4.5 MCG/ACT inhaler [[id number]]   simvastatin (ZOCOR) 20 MG tablet [[id number]]    Please send it to [id number]  [place], [place] [address]  Phone:  [phone] Fax:  [phone]  DEA #:  [id number]

## 2020-02-08 ENCOUNTER — Other Ambulatory Visit: Payer: Self-pay

## 2020-02-08 ENCOUNTER — Ambulatory Visit (INDEPENDENT_AMBULATORY_CARE_PROVIDER_SITE_OTHER): Payer: 59 | Admitting: Family Medicine

## 2020-02-08 ENCOUNTER — Ambulatory Visit: Payer: 59 | Admitting: Medical

## 2020-02-08 ENCOUNTER — Encounter: Payer: Self-pay | Admitting: Family Medicine

## 2020-02-08 VITALS — BP 138/82 | HR 85 | Temp 98.2°F | Ht 70.0 in | Wt 243.5 lb

## 2020-02-08 DIAGNOSIS — M25511 Pain in right shoulder: Secondary | ICD-10-CM | POA: Diagnosis not present

## 2020-02-08 DIAGNOSIS — R21 Rash and other nonspecific skin eruption: Secondary | ICD-10-CM | POA: Diagnosis not present

## 2020-02-08 DIAGNOSIS — R202 Paresthesia of skin: Secondary | ICD-10-CM | POA: Diagnosis not present

## 2020-02-08 DIAGNOSIS — M25512 Pain in left shoulder: Secondary | ICD-10-CM | POA: Diagnosis not present

## 2020-02-08 MED ORDER — PREDNISONE 20 MG PO TABS: 40.0000 mg | ORAL_TABLET | Freq: Every day | ORAL | 0 refills | Status: AC

## 2020-02-08 NOTE — Patient Instructions (Addendum)
Take Zyrtec or Allegra daily until the rash is better. Try not to itch. Cold can help.  Stay hydrated.  Wear the splints at night and during activities that flare it up during the day.   OK to take Tylenol 1000 mg (2 extra strength tabs) or 975 mg (3 regular strength tabs) every 6 hours as needed.  Heat (pad or rice pillow in microwave) over affected area, 10-15 minutes twice daily.   EXERCISES  RANGE OF MOTION (ROM) AND STRETCHING EXERCISES These exercises may help you when beginning to rehabilitate your injury. While completing these exercises, remember:   Restoring tissue flexibility helps normal motion to return to the joints. This allows healthier, less painful movement and activity.  An effective stretch should be held for at least 30 seconds.  A stretch should never be painful. You should only feel a gentle lengthening or release in the stretched tissue.  ROM - Pendulum  Bend at the waist so that your right / left arm falls away from your body. Support yourself with your opposite hand on a solid surface, such as a table or a countertop.  Your right / left arm should be perpendicular to the ground. If it is not perpendicular, you need to lean over farther. Relax the muscles in your right / left arm and shoulder as much as possible.  Gently sway your hips and trunk so they move your right / left arm without any use of your right / left shoulder muscles.  Progress your movements so that your right / left arm moves side to side, then forward and backward, and finally, both clockwise and counterclockwise.  Complete 10-15 repetitions in each direction. Many people use this exercise to relieve discomfort in their shoulder as well as to gain range of motion. Repeat 2 times. Complete this exercise 3 times per week.  STRETCH - Flexion, Standing  Stand with good posture. With an underhand grip on your right / left hand and an overhand grip on the opposite hand, grasp a broomstick or  cane so that your hands are a little more than shoulder-width apart.  Keeping your right / left elbow straight and shoulder muscles relaxed, push the stick with your opposite hand to raise your right / left arm in front of your body and then overhead. Raise your arm until you feel a stretch in your right / left shoulder, but before you have increased shoulder pain.  Try to avoid shrugging your right / left shoulder as your arm rises by keeping your shoulder blade tucked down and toward your mid-back spine. Hold 30 seconds.  Slowly return to the starting position. Repeat 2 times. Complete this exercise 3 times per week.  STRETCH - Internal Rotation  Place your right / left hand behind your back, palm-up.  Throw a towel or belt over your opposite shoulder. Grasp the towel/belt with your right / left hand.  While keeping an upright posture, gently pull up on the towel/belt until you feel a stretch in the front of your right / left shoulder.  Avoid shrugging your right / left shoulder as your arm rises by keeping your shoulder blade tucked down and toward your mid-back spine.  Hold 30. Release the stretch by lowering your opposite hand. Repeat 2 times. Complete this exercise 3 times per week.  STRETCH - External Rotation and Abduction  Stagger your stance through a doorframe. It does not matter which foot is forward.  As instructed by your physician, physical therapist or  athletic trainer, place your hands: ? And forearms above your head and on the door frame. ? And forearms at head-height and on the door frame. ? At elbow-height and on the door frame.  Keeping your head and chest upright and your stomach muscles tight to prevent over-extending your low-back, slowly shift your weight onto your front foot until you feel a stretch across your chest and/or in the front of your shoulders.  Hold 30 seconds. Shift your weight to your back foot to release the stretch. Repeat 2 times. Complete  this stretch 3 times per week.   STRENGTHENING EXERCISES  These exercises may help you when beginning to rehabilitate your injury. They may resolve your symptoms with or without further involvement from your physician, physical therapist or athletic trainer. While completing these exercises, remember:   Muscles can gain both the endurance and the strength needed for everyday activities through controlled exercises.  Complete these exercises as instructed by your physician, physical therapist or athletic trainer. Progress the resistance and repetitions only as guided.  You may experience muscle soreness or fatigue, but the pain or discomfort you are trying to eliminate should never worsen during these exercises. If this pain does worsen, stop and make certain you are following the directions exactly. If the pain is still present after adjustments, discontinue the exercise until you can discuss the trouble with your clinician.  If advised by your physician, during your recovery, avoid activity or exercises which involve actions that place your right / left hand or elbow above your head or behind your back or head. These positions stress the tissues which are trying to heal.  STRENGTH - Scapular Depression and Adduction  With good posture, sit on a firm chair. Supported your arms in front of you with pillows, arm rests or a table top. Have your elbows in line with the sides of your body.  Gently draw your shoulder blades down and toward your mid-back spine. Gradually increase the tension without tensing the muscles along the top of your shoulders and the back of your neck.  Hold for 3 seconds. Slowly release the tension and relax your muscles completely before completing the next repetition.  After you have practiced this exercise, remove the arm support and complete it in standing as well as sitting. Repeat 2 times. Complete this exercise 3 times per week.   STRENGTH - External  Rotators  Secure a rubber exercise band/tubing to a fixed object so that it is at the same height as your right / left elbow when you are standing or sitting on a firm surface.  Stand or sit so that the secured exercise band/tubing is at your side that is not injured.  Bend your elbow 90 degrees. Place a folded towel or small pillow under your right / left arm so that your elbow is a few inches away from your side.  Keeping the tension on the exercise band/tubing, pull it away from your body, as if pivoting on your elbow. Be sure to keep your body steady so that the movement is only coming from your shoulder rotating.  Hold 3 seconds. Release the tension in a controlled manner as you return to the starting position. Repeat 2 times. Complete this exercise 3 times per week.   STRENGTH - Supraspinatus  Stand or sit with good posture. Grasp a 2-3 lb weight or an exercise band/tubing so that your hand is "thumbs-up," like when you shake hands.  Slowly lift your right / left  hand from your thigh into the air, traveling about 30 degrees from straight out at your side. Lift your hand to shoulder height or as far as you can without increasing any shoulder pain. Initially, many people do not lift their hands above shoulder height.  Avoid shrugging your right / left shoulder as your arm rises by keeping your shoulder blade tucked down and toward your mid-back spine.  Hold for 3 seconds. Control the descent of your hand as you slowly return to your starting position. Repeat 2 times. Complete this exercise 3 times per week.   STRENGTH - Shoulder Extensors  Secure a rubber exercise band/tubing so that it is at the height of your shoulders when you are either standing or sitting on a firm arm-less chair.  With a thumbs-up grip, grasp an end of the band/tubing in each hand. Straighten your elbows and lift your hands straight in front of you at shoulder height. Step back away from the secured end of  band/tubing until it becomes tense.  Squeezing your shoulder blades together, pull your hands down to the sides of your thighs. Do not allow your hands to go behind you.  Hold for 3 seconds. Slowly ease the tension on the band/tubing as you reverse the directions and return to the starting position. Repeat 2 times. Complete this exercise 3 times per week.   STRENGTH - Scapular Retractors  Secure a rubber exercise band/tubing so that it is at the height of your shoulders when you are either standing or sitting on a firm arm-less chair.  With a palm-down grip, grasp an end of the band/tubing in each hand. Straighten your elbows and lift your hands straight in front of you at shoulder height. Step back away from the secured end of band/tubing until it becomes tense.  Squeezing your shoulder blades together, draw your elbows back as you bend them. Keep your upper arm lifted away from your body throughout the exercise.  Hold 3 seconds. Slowly ease the tension on the band/tubing as you reverse the directions and return to the starting position. Repeat 2 times. Complete this exercise 3 times per week.  STRENGTH - Scapular Depressors  Find a sturdy chair without wheels, such as a from a dining room table.  Keeping your feet on the floor, lift your bottom from the seat and lock your elbows.  Keeping your elbows straight, allow gravity to pull your body weight down. Your shoulders will rise toward your ears.  Raise your body against gravity by drawing your shoulder blades down your back, shortening the distance between your shoulders and ears. Although your feet should always maintain contact with the floor, your feet should progressively support less body weight as you get stronger.  Hold 3 seconds. In a controlled and slow manner, lower your body weight to begin the next repetition. Repeat 2 times. Complete this exercise 3 times per week.   This information is not intended to replace advice  given to you by your health care provider. Make sure you discuss any questions you have with your health care provider.  Document Released: 05/30/2005 Document Revised: 08/06/2014 Document Reviewed: 10/28/2008 Elsevier Interactive Patient Education [employer].

## 2020-02-08 NOTE — Progress Notes (Signed)
Chief Complaint  Patient presents with   Urticaria   Neck Pain    David [name] is a 59 y.o. male here for a skin complaint.  Duration: 9 days Location: arms and legs Pruritic? Yes Painful? No Drainage? No New soaps/lotions/topicals/detergents? No Sick contacts? No Other associated symptoms: no swelling in mouth/tongue, throat, no SOB.  Therapies tried thus far: topical cortisone cream  9 d hx of shoulder/neck. No inj or change in activity. +tingling in fingers. No bruising, redness, swelling, weakness. Has not tried anything. No famhx of OA or inflammatory arthropathy.   Past Medical History:  Diagnosis Date   COPD (chronic obstructive pulmonary disease) ([place])    (06-17-2019 per pt's pcp, dr Nani Ravens ,  dx copd unspecified type 08-17-2016 note in epic PFTs showed some obstruction per note)   Hyperlipidemia    Hyperplasia of prostate with lower urinary tract symptoms (LUTS)    Hypertension    followed by pcp (06-17-2019 per pt never had stress test)   Prostate cancer [facility]) urologist-- dr wrenn/  oncologist-- dr Tammi Klippel   dx 12-24-2018-- Stage T2a, Gleason 3+4   Seasonal allergies     BP 138/82 (BP Location: Right Arm, Patient Position: Sitting, Cuff Size: Large)    Pulse 85    Temp 98.2 F (36.8 C) (Oral)    Ht 5\' 10"  (1.778 m)    Wt 243 lb 8 oz (110.5 kg)    SpO2 98%    BMI 34.94 kg/m  Gen: awake, alert, appearing stated age Lungs: No accessory muscle use Mouth: Tongue is symmetric and does not appear swollen, MMM, no swelling of the uvula Neck: Supple, symmetric, no swelling noted Skin: + Scattered papules and slight erythematous pigmentation over the forearms and legs. No drainage, TTP, fluctuance, excoriation MSK: No tenderness to palpation of the neck, trapezius, or on the shoulder exam; negative Spurling's, crossover, speeds, liftoff, Neer's, Hawkins; positive empty can on the right Neuro: Gait is normal, sensation intact to light touch in upper extremities  bilaterally; negative Tinel's and Phalen's Psych: Age appropriate judgment and insight  Rash - Plan: predniSONE (DELTASONE) 20 MG tablet  Acute pain of both shoulders - Plan: predniSONE (DELTASONE) 20 MG tablet  Paresthesia of both hands  1-5-day prednisone burst, 40 mg daily. Take a daily antihistamine. This appears to be getting better per the patient. No signs of anaphylaxis. 2-prednisone, stretches and exercises. Would consider polymyalgia rheumatica given intact strength and proximal pain. I would like to see him next week if no improvement and we will undergo autoimmune work-up. Could consider physical therapy. 3-bilateral wrist plans provided. If no improvement will undergo lab work-up for paresthesias. The patient voiced understanding and agreement to the plan.  [place], DO 02/08/20 4:33 PM

## 2020-02-17 ENCOUNTER — Encounter: Payer: Self-pay | Admitting: Family Medicine

## 2020-02-17 ENCOUNTER — Ambulatory Visit (INDEPENDENT_AMBULATORY_CARE_PROVIDER_SITE_OTHER): Payer: 59 | Admitting: Family Medicine

## 2020-02-17 ENCOUNTER — Other Ambulatory Visit: Payer: Self-pay

## 2020-02-17 ENCOUNTER — Ambulatory Visit: Payer: 59 | Admitting: Family Medicine

## 2020-02-17 VITALS — BP 112/68 | HR 104 | Temp 98.5°F | Ht 70.0 in | Wt 246.0 lb

## 2020-02-17 DIAGNOSIS — Z09 Encounter for follow-up examination after completed treatment for conditions other than malignant neoplasm: Secondary | ICD-10-CM

## 2020-02-17 DIAGNOSIS — G5603 Carpal tunnel syndrome, bilateral upper limbs: Secondary | ICD-10-CM | POA: Diagnosis not present

## 2020-02-17 NOTE — Patient Instructions (Addendum)
Wear the wrist splints as tolerated, preferably at night or when you are doing activities that aggravate your symptoms.   Shoulder stretches/exercises as needed.  Let me know if it comes back and they don't help though.  Skin looks good, no changes for now.  Let us know if you need anything.

## 2020-02-17 NOTE — Progress Notes (Signed)
Chief Complaint  Patient presents with  . Follow-up    Hives    Subjective: Patient is a 59 y.o. male here for f/u.  Rash and shoulder pain have both resolved. He was compliant with therapy.  He has mild s/s's with his hands still having paresthesias in his 1st 3 digits. He was compliant early w the stretches/exercises. Stopped wearing due to awkwardness. Not dropping things or having pain.    Past Medical History:  Diagnosis Date  . COPD (chronic obstructive pulmonary disease) ([place])    (06-17-2019 per pt's pcp, dr Nani Ravens ,  dx copd unspecified type 08-17-2016 note in epic PFTs showed some obstruction per note)  . Hyperlipidemia   . Hyperplasia of prostate with lower urinary tract symptoms (LUTS)   . Hypertension    followed by pcp (06-17-2019 per pt never had stress test)  . Prostate cancer [facility]) urologist-- dr wrenn/  oncologist-- dr Tammi Klippel   dx 12-24-2018-- Stage T2a, Gleason 3+4  . Seasonal allergies     Objective: BP 112/68 (BP Location: Left Arm, Patient Position: Sitting, Cuff Size: Large)   Pulse (!) 104   Temp 98.5 F (36.9 C) (Oral)   Ht '5\' 10"'  (1.778 m)   Wt 246 lb (111.6 kg)   SpO2 97%   BMI 35.30 kg/m  General: Awake, appears stated age Skin: No lesions noted Neuro: DTR's equal and symmetric in UE's b/l, no clonus; neg Phalen's and Tinel's b/l, nml sensory function b/l at time of exam MSK: Neg shoulder exam b/l, no ttp, good ROM Lungs: No accessory muscle use Psych: Age appropriate judgment and insight, normal affect and mood  Assessment and Plan: Bilateral carpal tunnel syndrome  Follow-up for resolved condition  Try to wear splints when able if s/s's worsen. Other issues resolved. If shoulder issue returns, will ck ANA w reflex, CRP, ESR and Aldolase.  F/u in 3 mo for CPE.  The patient voiced understanding and agreement to the plan.  [place], DO 02/17/20  2:26 PM

## 2020-05-31 ENCOUNTER — Encounter: Payer: 59 | Admitting: Family Medicine

## 2020-06-10 ENCOUNTER — Encounter: Payer: 59 | Admitting: Family Medicine

## 2020-09-16 ENCOUNTER — Other Ambulatory Visit: Payer: Self-pay | Admitting: Family Medicine

## 2020-09-16 DIAGNOSIS — I10 Essential (primary) hypertension: Secondary | ICD-10-CM

## 2020-12-23 ENCOUNTER — Other Ambulatory Visit: Payer: Self-pay | Admitting: Family Medicine

## 2020-12-23 DIAGNOSIS — E785 Hyperlipidemia, unspecified: Secondary | ICD-10-CM

## 2021-01-09 ENCOUNTER — Other Ambulatory Visit: Payer: Self-pay | Admitting: Family Medicine

## 2021-02-02 ENCOUNTER — Other Ambulatory Visit: Payer: Self-pay | Admitting: Family Medicine

## 2021-02-02 DIAGNOSIS — J4540 Moderate persistent asthma, uncomplicated: Secondary | ICD-10-CM

## 2021-03-10 ENCOUNTER — Other Ambulatory Visit: Payer: Self-pay | Admitting: Family Medicine

## 2021-03-29 ENCOUNTER — Other Ambulatory Visit: Payer: Self-pay | Admitting: Family Medicine

## 2021-03-29 DIAGNOSIS — E785 Hyperlipidemia, unspecified: Secondary | ICD-10-CM

## 2021-05-21 IMAGING — CR DG CHEST 2V
2 series · 2 of 2 positions shown · non-contrast
Comparison: Chest x-ray 08/02/2016.

CLINICAL DATA: 58-year-old male under preoperative evaluation.

EXAM:
CHEST - 2 VIEW

[w chest pa]
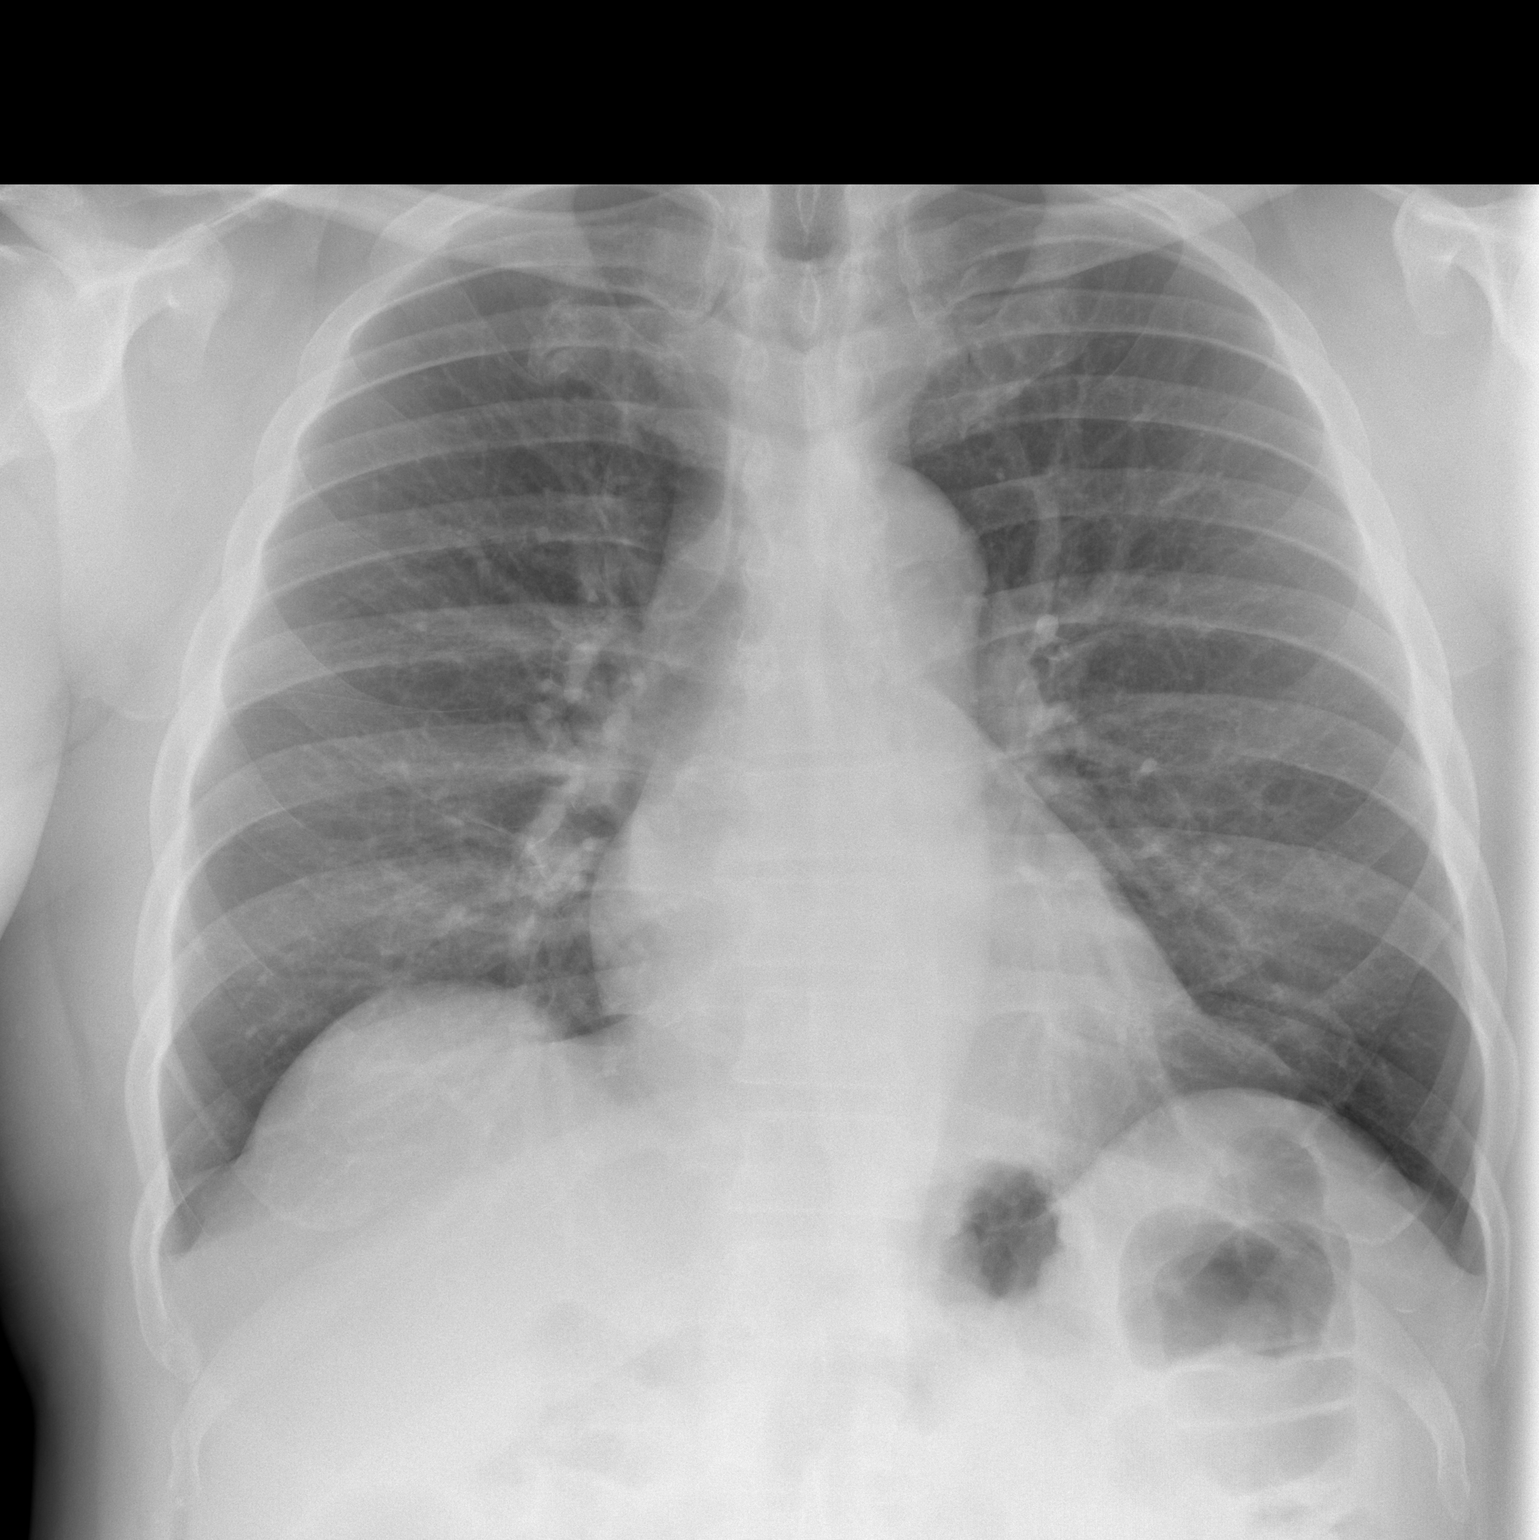

[w chest lat]
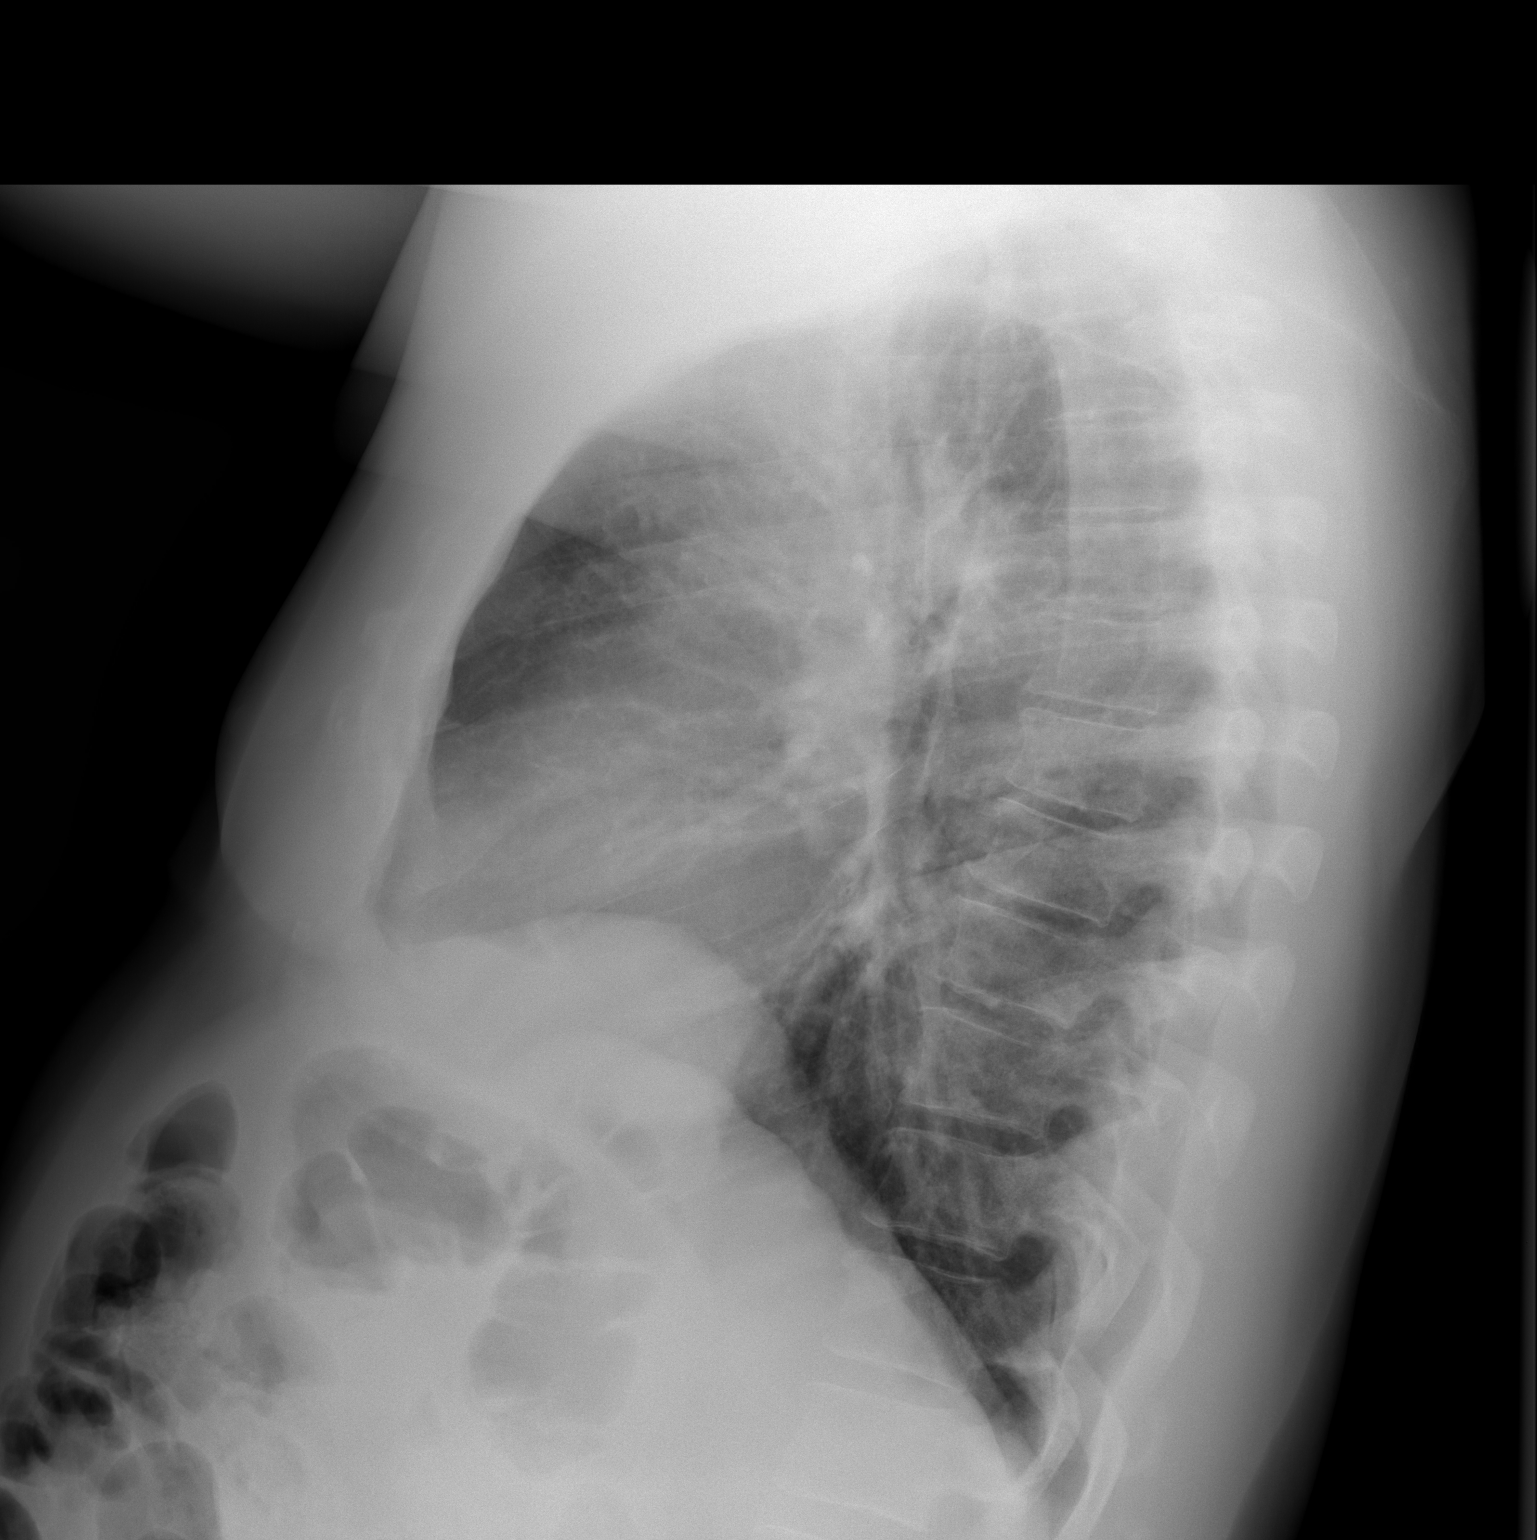

[2 of 2 positions shown; findings below may reference images not displayed]

FINDINGS: Lung volumes are normal. No consolidative airspace disease. No
pleural effusions. No pneumothorax. No pulmonary nodule or mass
noted. Pulmonary vasculature and the cardiomediastinal silhouette
are within normal limits.
IMPRESSION: No radiographic evidence of acute cardiopulmonary disease.

## 2021-06-05 ENCOUNTER — Other Ambulatory Visit: Payer: Self-pay | Admitting: Family Medicine

## 2021-06-05 DIAGNOSIS — I10 Essential (primary) hypertension: Secondary | ICD-10-CM

## 2021-06-15 ENCOUNTER — Other Ambulatory Visit: Payer: Self-pay | Admitting: Family Medicine

## 2021-06-15 DIAGNOSIS — E785 Hyperlipidemia, unspecified: Secondary | ICD-10-CM

## 2021-09-25 ENCOUNTER — Other Ambulatory Visit: Payer: Self-pay | Admitting: Family Medicine

## 2021-09-25 DIAGNOSIS — E785 Hyperlipidemia, unspecified: Secondary | ICD-10-CM

## 2021-12-06 ENCOUNTER — Other Ambulatory Visit: Payer: Self-pay | Admitting: Family Medicine

## 2021-12-06 DIAGNOSIS — E785 Hyperlipidemia, unspecified: Secondary | ICD-10-CM

## 2021-12-16 ENCOUNTER — Other Ambulatory Visit: Payer: Self-pay | Admitting: Family Medicine

## 2021-12-16 DIAGNOSIS — J4540 Moderate persistent asthma, uncomplicated: Secondary | ICD-10-CM

## 2022-03-06 ENCOUNTER — Other Ambulatory Visit: Payer: Self-pay | Admitting: Family Medicine

## 2022-03-06 DIAGNOSIS — E785 Hyperlipidemia, unspecified: Secondary | ICD-10-CM

## 2022-03-06 DIAGNOSIS — I10 Essential (primary) hypertension: Secondary | ICD-10-CM

## 2022-06-04 ENCOUNTER — Other Ambulatory Visit: Payer: Self-pay | Admitting: Family Medicine

## 2022-06-04 DIAGNOSIS — E785 Hyperlipidemia, unspecified: Secondary | ICD-10-CM

## 2022-07-09 ENCOUNTER — Other Ambulatory Visit: Payer: Self-pay | Admitting: Family Medicine

## 2022-08-27 ENCOUNTER — Encounter: Payer: Self-pay | Admitting: Family Medicine

## 2022-08-27 ENCOUNTER — Ambulatory Visit (INDEPENDENT_AMBULATORY_CARE_PROVIDER_SITE_OTHER): Payer: 59 | Admitting: Family Medicine

## 2022-08-27 VITALS — BP 132/76 | HR 89 | Temp 98.2°F | Ht 70.5 in | Wt 247.1 lb

## 2022-08-27 DIAGNOSIS — R0981 Nasal congestion: Secondary | ICD-10-CM

## 2022-08-27 DIAGNOSIS — Z1211 Encounter for screening for malignant neoplasm of colon: Secondary | ICD-10-CM

## 2022-08-27 MED ORDER — PREDNISONE 20 MG PO TABS: 40.0000 mg | ORAL_TABLET | Freq: Every day | ORAL | 0 refills | Status: AC

## 2022-08-27 MED ORDER — FLUTICASONE PROPIONATE 50 MCG/ACT NA SUSP: 2.0000 | Freq: Every day | NASAL | 6 refills | Status: DC

## 2022-08-27 NOTE — Patient Instructions (Addendum)
Continue the saline rinses if helpful.   OK to take Tylenol 1000 mg (2 extra strength tabs) or 975 mg (3 regular strength tabs) every 6 hours as needed.  Claritin (loratadine), Allegra (fexofenadine), Zyrtec (cetirizine) which is also equivalent to Xyzal (levocetirizine); these are listed in order from weakest to strongest. Generic, and therefore cheaper, options are in the parentheses.   Flonase (fluticasone); nasal spray that is over the counter. 2 sprays each nostril, once daily. Aim towards the same side eye when you spray.  There are available OTC, and the generic versions, which may be cheaper, are in parentheses. Show this to a pharmacist if you have trouble finding any of these items.  Let us know if you need anything.

## 2022-08-27 NOTE — Progress Notes (Signed)
Chief Complaint  Patient presents with   Sinus Problem    David [name] here for URI complaints.  Duration: 3 weeks off-and-on Associated symptoms: sinus congestion, scent of burnt peanuts, sinus pain, and rhinorrhea Denies: itchy watery eyes, ear pain, ear drainage, sore throat, wheezing, shortness of breath, myalgia, and fevers Treatment to date: OTC decongestants Sick contacts: No  Past Medical History:  Diagnosis Date   COPD (chronic obstructive pulmonary disease) ([place])    (06-17-2019 per pt's pcp, dr David [name] ,  dx copd unspecified type 08-17-2016 note in epic PFTs showed some obstruction per note)   Hyperlipidemia    Hyperplasia of prostate with lower urinary tract symptoms (LUTS)    Hypertension    followed by pcp (06-17-2019 per pt never had stress test)   Prostate cancer [facility]) urologist-- dr David [name]/  oncologist-- dr David [name]   dx 12-24-2018-- Stage T2a, Gleason 3+4   Seasonal allergies     Objective BP 132/76 (BP Location: Left Arm, Patient Position: Sitting, Cuff Size: Large)   Pulse 89   Temp 98.2 F (36.8 C) (Oral)   Ht 5' 10.5" (1.791 m)   Wt 247 lb 2 oz (112.1 kg)   SpO2 97%   BMI 34.96 kg/m  General: Awake, alert, appears stated age 62: AT, [place], ears patent b/l and TM's neg, nares patent w/o discharge on right, clear rhinorrhea on the left, turbinates are not edematous pharynx pink and without exudates, MMM, no sinus ttp Neck: No masses or asymmetry Heart: RRR Lungs: CTAB, no accessory muscle use Psych: Age appropriate judgment and insight, normal mood and affect  Nasal congestion - Plan: predniSONE (DELTASONE) 20 MG tablet, fluticasone (FLONASE) 50 MCG/ACT nasal spray  Screen for colon cancer - Plan: Ambulatory referral to Gastroenterology  5 d pred burst 40 mg daily.  Start Flonase for 60 days.  If no improvement, would consider adding oral antihistamine and ENT referral.   Refer to GI for colon cancer screening.  Follow-up in 3 months for  physical. Pt voiced understanding and agreement to the plan.  [name], DO 08/27/22 3:51 PM

## 2022-09-02 ENCOUNTER — Other Ambulatory Visit: Payer: Self-pay | Admitting: Family Medicine

## 2022-09-02 DIAGNOSIS — E785 Hyperlipidemia, unspecified: Secondary | ICD-10-CM

## 2022-09-24 ENCOUNTER — Telehealth: Payer: Self-pay | Admitting: Family Medicine

## 2022-09-24 DIAGNOSIS — R0981 Nasal congestion: Secondary | ICD-10-CM

## 2022-09-24 MED ORDER — AZITHROMYCIN 250 MG PO TABS: ORAL_TABLET | ORAL | 0 refills | Status: DC

## 2022-09-24 NOTE — Telephone Encounter (Signed)
Pt called to advise he is still suffering from what feels like an infection of some kind. Pt said he took the prednisone that was prescribed but wants to know if something else can be sent in to help. Please advise pt. He uses [facility] on [address] and [address]

## 2022-09-24 NOTE — Telephone Encounter (Signed)
I sent another medication, I want him to continue the Flonase, we could consider ENT referral if still no better. Ty.

## 2022-09-24 NOTE — Telephone Encounter (Signed)
Called left message to call back 

## 2022-09-25 NOTE — Telephone Encounter (Signed)
Called left message but was unable to take the call.

## 2022-09-25 NOTE — Telephone Encounter (Signed)
Called left message to call back 

## 2022-09-25 NOTE — Telephone Encounter (Signed)
Caller Name [place] Phone Number [phone] Call Type Message Only Information Provided Reason for Call Returning a Call from the Office Initial [place] states they are returning a call form the office. Disp. Time Disposition Final User 09/24/2022 5:08:49 PM General Information Provided Yes Judith Blonder Call Closed By: Judith Blonder Transaction Date/Time: 09/24/2022 5:07:12 PM (ET)

## 2022-09-25 NOTE — Telephone Encounter (Signed)
Pt notified of note below.

## 2022-10-04 NOTE — Telephone Encounter (Signed)
ENT referral placed.

## 2022-10-04 NOTE — Telephone Encounter (Signed)
Patient states he is still have congestions issues and would like to go ahead and get referred to an ENT. Please advise.

## 2022-10-04 NOTE — Telephone Encounter (Signed)
OK to refer to ENT, dx chronic nasal congestion. Ty.

## 2022-10-04 NOTE — Telephone Encounter (Signed)
Left message on machine that referral has been placed.

## 2022-10-04 NOTE — Addendum Note (Signed)
Addended byDamita Dunnings D on: 10/04/2022 02:34 PM   Modules accepted: Orders

## 2022-12-01 ENCOUNTER — Other Ambulatory Visit: Payer: Self-pay | Admitting: Family Medicine

## 2022-12-01 DIAGNOSIS — I10 Essential (primary) hypertension: Secondary | ICD-10-CM

## 2022-12-01 DIAGNOSIS — E785 Hyperlipidemia, unspecified: Secondary | ICD-10-CM

## 2022-12-03 ENCOUNTER — Ambulatory Visit (INDEPENDENT_AMBULATORY_CARE_PROVIDER_SITE_OTHER): Payer: 59 | Admitting: Family Medicine

## 2022-12-03 ENCOUNTER — Encounter: Payer: Self-pay | Admitting: Family Medicine

## 2022-12-03 VITALS — BP 118/72 | HR 85 | Temp 98.7°F | Ht 70.0 in | Wt 247.4 lb

## 2022-12-03 DIAGNOSIS — E785 Hyperlipidemia, unspecified: Secondary | ICD-10-CM

## 2022-12-03 DIAGNOSIS — Z0000 Encounter for general adult medical examination without abnormal findings: Secondary | ICD-10-CM

## 2022-12-03 DIAGNOSIS — I10 Essential (primary) hypertension: Secondary | ICD-10-CM

## 2022-12-03 LAB — COMPREHENSIVE METABOLIC PANEL
ALT: 19 U/L (ref 0–53)
AST: 25 U/L (ref 0–37)
Albumin: 4.5 g/dL (ref 3.5–5.2)
Alkaline Phosphatase: 70 U/L (ref 39–117)
BUN: 11 mg/dL (ref 6–23)
CO2: 30 mEq/L (ref 19–32)
Calcium: 9.2 mg/dL (ref 8.4–10.5)
Chloride: 99 mEq/L (ref 96–112)
Creatinine, Ser: 1.17 mg/dL (ref 0.40–1.50)
GFR: 68.16 mL/min (ref >60.00)
Glucose, Bld: 100 mg/dL — ABNORMAL HIGH (ref 70–99)
Potassium: 3.9 mEq/L (ref 3.5–5.1)
Sodium: 137 mEq/L (ref 135–145)
Total Bilirubin: 1.2 mg/dL (ref 0.2–1.2)
Total Protein: 6.9 g/dL (ref 6.0–8.3)

## 2022-12-03 LAB — CBC
HCT: 46.5 % (ref 39.0–52.0)
Hemoglobin: 15.2 g/dL (ref 13.0–17.0)
MCHC: 32.7 g/dL (ref 30.0–36.0)
MCV: 84.1 fl (ref 78.0–100.0)
Platelets: 262 10*3/uL (ref 150.0–400.0)
RBC: 5.52 Mil/uL (ref 4.22–5.81)
RDW: 14.1 % (ref 11.5–15.5)
WBC: 4.6 10*3/uL (ref 4.0–10.5)

## 2022-12-03 LAB — LIPID PANEL
Cholesterol: 152 mg/dL (ref 0–200)
HDL: 40.8 mg/dL (ref >39.00)
LDL Cholesterol: 96 mg/dL (ref 0–99)
NonHDL: 111.33
Total CHOL/HDL Ratio: 4
Triglycerides: 79 mg/dL (ref 0.0–149.0)
VLDL: 15.8 mg/dL (ref 0.0–40.0)

## 2022-12-03 MED ORDER — SIMVASTATIN 20 MG PO TABS: ORAL_TABLET | ORAL | 2 refills | Status: DC

## 2022-12-03 MED ORDER — LOSARTAN POTASSIUM-HCTZ 100-12.5 MG PO TABS
1.0000 | ORAL_TABLET | Freq: Every day | ORAL | 2 refills | Status: DC
Start: 2022-12-03 — End: 2023-08-08

## 2022-12-03 NOTE — Progress Notes (Signed)
Chief Complaint  Patient presents with   Annual Exam    [name]   His last physical was >1 year ago.  Current diet: in general, diet could be better.  Current exercise: walking Weight trend: stable Fatigue out of ordinary? No. Seat belt? Yes.   Advanced directive? No  Health maintenance Shingrix- No Colonoscopy- Due Tetanus- Yes HIV- Yes Hep C- Yes   Past Medical History:  Diagnosis Date   COPD (chronic obstructive pulmonary disease) (HCC)    (06-17-2019 per pt's pcp, dr Carmelia Roller ,  dx copd unspecified type 08-17-2016 note in epic PFTs showed some obstruction per note)   Hyperlipidemia    Hyperplasia of prostate with lower urinary tract symptoms (LUTS)    Hypertension    followed by pcp (06-17-2019 per pt never had stress test)   Prostate cancer [facility]) urologist-- dr wrenn/  oncologist-- dr Kathrynn Running   dx 12-24-2018-- Stage T2a, Gleason 3+4   Seasonal allergies       Past Surgical History:  Procedure Laterality Date   CYSTOSCOPY N/A 06/18/2019   Procedure: CYSTOSCOPY;  Surgeon: Bjorn Pippin, MD;  Location: [facility];  Service: Urology;  Laterality: N/A;  No seeds detected in bladder per Dr. Annabell Howells   NO PAST SURGERIES     PROSTATE BIOPSY  12-24-2018  dr Annabell Howells office   RADIOACTIVE SEED IMPLANT N/A 06/18/2019   Procedure: RADIOACTIVE SEED IMPLANT/BRACHYTHERAPY IMPLANT;  Surgeon: Bjorn Pippin, MD;  Location: [facility];  Service: Urology;  Laterality: N/A;   SPACE OAR INSTILLATION N/A 06/18/2019   Procedure: SPACE OAR INSTILLATION;  Surgeon: Bjorn Pippin, MD;  Location: [facility];  Service: Urology;  Laterality: N/A;    Medications  Current Outpatient Medications on File Prior to Visit  Medication Sig Dispense Refill   budesonide-formoterol (SYMBICORT) 80-4.5 MCG/ACT inhaler INHALE 2 PUFFS INTO THE LUNGS TWICE DAILY 10.2 g 5   fluticasone (FLONASE) 50 MCG/ACT nasal spray Place  2 sprays into both nostrils daily. 16 g 6   sildenafil (VIAGRA) 100 MG tablet TAKE ONE-HALF TO ONE TABLET BY MOUTH DAILY AS NEEDED FOR ERECTILE DYSFUNCTION 30 tablet 0   Allergies No Known Allergies  Family History Family History  Problem Relation Age of Onset   Diabetes Mother        Living   Hypertension Father    Diabetes Father        Deceased   Lung cancer Father 66   Diabetes Maternal Grandmother        Deceased   Hypertension Maternal Grandfather    Stroke Maternal Grandfather        Deceased   Diabetes Other        Maternal Aunts & Uncles [8]   Hypertension Other        Paternal Aunts & Uncles   Diabetes Sister        #1-Deceased   Hypertension Sister        #2   Healthy Daughter        x1   Breast cancer Neg Hx    Colon cancer Neg Hx    Pancreatic cancer Neg Hx     Review of Systems: Constitutional:  no fevers Eye:  no recent significant change in vision Ear/Nose/Mouth/Throat:  Ears:  no hearing loss Nose/Mouth/Throat:  no complaints of nasal congestion, no sore throat Cardiovascular:  no chest pain Respiratory:  no shortness of breath Gastrointestinal:  no change in bowel  habits GU:  Male: negative for dysuria, frequency Musculoskeletal/Extremities:  no joint pain Integumentary (Skin/Breast):  no abnormal skin lesions reported Neurologic:  no headaches Endocrine: No unexpected weight changes Hematologic/Lymphatic:  no abnormal bleeding  Exam BP 118/72 (BP Location: Left Arm, Patient Position: Sitting, Cuff Size: Large)   Pulse 85   Temp 98.7 F (37.1 C) (Oral)   Ht 5\' 10"  (1.778 m)   Wt 247 lb 6 oz (112.2 kg)   SpO2 96%   BMI 35.49 kg/m  General:  well developed, well nourished, in no apparent distress Skin:  no significant moles, warts, or growths Head:  no masses, lesions, or tenderness Eyes:  pupils equal and round, sclera anicteric without injection Ears:  canals without lesions, TMs shiny without retraction, no obvious effusion, no  erythema Nose:  nares patent, mucosa normal Throat/Pharynx:  lips and gingiva without lesion; tongue and uvula midline; non-inflamed pharynx; no exudates or postnasal drainage Neck: neck supple without adenopathy, thyromegaly, or masses Cardiac: RRR, no bruits, no LE edema Lungs:  clear to auscultation, breath sounds equal bilaterally, no respiratory distress Abdomen: BS+, soft, non-tender, non-distended, no masses or organomegaly noted Rectal: Deferred Musculoskeletal:  symmetrical muscle groups noted without atrophy or deformity Neuro:  gait normal; deep tendon reflexes normal and symmetric Psych: well oriented with normal range of affect and appropriate judgment/insight  Assessment and Plan  Well adult exam - Plan: CBC, Comprehensive metabolic panel, Lipid panel  Essential hypertension, benign - Plan: losartan-hydrochlorothiazide (HYZAAR) 100-12.5 MG tablet  Hyperlipidemia, unspecified hyperlipidemia type - Plan: simvastatin (ZOCOR) 20 MG tablet   Well 62 y.o. male. Counseled on diet and exercise. Pt w hx of prostate cancer following w urology.  Immunizations, labs, and further orders as above. Shingrix rec'd.  Advanced directive form provided today.  Follow up 6 mo. The patient voiced understanding and agreement to the plan.  Jilda Roche [place], DO 12/03/22 8:20 AM

## 2022-12-03 NOTE — Patient Instructions (Addendum)
Give [place] 2-3 business days to get the results of your labs back.   Keep the diet clean and stay active.  Please contact the GI team at: [phone]  Please get me a copy of your advanced directive form at your convenience.   Let me know if you want to see the ENT team via MyChart.   The Shingrix vaccine (for shingles) is a 2 shot series spaced 2-6 months apart. It can make people feel low energy, achy and almost like they have the flu for 48 hours after injection. 1/5 people can have nausea and/or vomiting. Please plan accordingly when deciding on when to get this shot. Call our office for a nurse visit appointment to get this. The second shot of the series is less severe regarding the side effects, but it still lasts 48 hours.   Let us know if you need anything.

## 2022-12-07 ENCOUNTER — Other Ambulatory Visit: Payer: Self-pay | Admitting: Family Medicine

## 2022-12-07 DIAGNOSIS — J4540 Moderate persistent asthma, uncomplicated: Secondary | ICD-10-CM

## 2022-12-07 MED ORDER — BUDESONIDE-FORMOTEROL FUMARATE 80-4.5 MCG/ACT IN AERO
2.0000 | INHALATION_SPRAY | Freq: Two times a day (BID) | RESPIRATORY_TRACT | 5 refills | Status: DC
Start: 2022-12-07 — End: 2023-11-13

## 2023-01-16 ENCOUNTER — Encounter: Payer: Self-pay | Admitting: Family Medicine

## 2023-01-16 ENCOUNTER — Ambulatory Visit (INDEPENDENT_AMBULATORY_CARE_PROVIDER_SITE_OTHER): Payer: 59 | Admitting: Family Medicine

## 2023-01-16 VITALS — BP 124/78 | HR 90 | Temp 98.4°F | Ht 70.0 in | Wt 247.0 lb

## 2023-01-16 DIAGNOSIS — J3489 Other specified disorders of nose and nasal sinuses: Secondary | ICD-10-CM | POA: Diagnosis not present

## 2023-01-16 DIAGNOSIS — I10 Essential (primary) hypertension: Secondary | ICD-10-CM

## 2023-01-16 MED ORDER — METHYLPREDNISOLONE ACETATE 80 MG/ML IJ SUSP
80.0000 mg | Freq: Once | INTRAMUSCULAR | Status: AC
Start: 2023-01-16 — End: 2023-01-16
  Administered 2023-01-16: 80 mg via INTRAMUSCULAR

## 2023-01-16 NOTE — Patient Instructions (Signed)
Send me a message in 2 days if the headache is still there.   Check your blood pressures 2-3 times per week, alternating the time of day you check it. If it is high, considering waiting 1-2 minutes and rechecking. If it gets higher, your anxiety is likely creeping up and we should avoid rechecking.   Send me a message in 2 weeks with some blood pressure readings.   Stay on the Hyzaar for now.   I want your blood pressure less than 140 on the top and less than 90 on the bottom consistently. Both goals must be met (ie, 150/70 is too high even though the 70 on the bottom is desirable).   Keep the diet clean and stay active.  Let us know if you need anything.

## 2023-01-16 NOTE — Progress Notes (Signed)
Chief Complaint  Patient presents with   Headache    Not sure if HA due to sinus or BP Headache every morning for 1 week and BP checked at work has been elevated.    Subjective David [name] is a 62 y.o. male who presents for hypertension follow up. He does monitor home blood pressures. Blood pressures ranging from 150's/90's on average. He is compliant with medications- Hyzaar 100-12.5 mg/d. Patient has these side effects of medication: none He is sometimes adhering to a healthy diet overall. Current exercise: walking No CP or SOB.  R sinus pain over eyebrow.  Gets in the morning once he gets out of bed, resolves with ibuprofen.  Has been going on for a week.  Not getting better.  Denies any fevers, jaw pain, temporal pain, vision changes, eye pain, difficulty with speech, trouble swallowing, balance issues, weakness, numbness/tingling.   Past Medical History:  Diagnosis Date   COPD (chronic obstructive pulmonary disease) (HCC)    (06-17-2019 per pt's pcp, dr Carmelia Roller ,  dx copd unspecified type 08-17-2016 note in epic PFTs showed some obstruction per note)   Hyperlipidemia    Hyperplasia of prostate with lower urinary tract symptoms (LUTS)    Hypertension    followed by pcp (06-17-2019 per pt never had stress test)   Prostate cancer [facility]) urologist-- dr wrenn/  oncologist-- dr Kathrynn Running   dx 12-24-2018-- Stage T2a, Gleason 3+4   Seasonal allergies     Exam BP 124/78 (BP Location: Left Arm, Cuff Size: Large)   Pulse 90   Temp 98.4 F (36.9 C) (Oral)   Ht 5\' 10"  (1.778 m)   Wt 247 lb (112 kg)   SpO2 99%   BMI 35.44 kg/m  General:  well developed, well nourished, in no apparent distress Heart: RRR, no bruits, no LE edema Lungs: clear to auscultation, no accessory muscle use HEENT: Ear canals patent without dc, sinus TTP over the left frontal sinus, nares are patent without discharge, MMM, no pharyngeal exudate or erythema, no dental decay Psych: well oriented with normal  range of affect and appropriate judgment/insight  Essential hypertension, benign  Sinus pain - Plan: methylPREDNISolone acetate (DEPO-MEDROL) injection 80 mg  Normal on recheck which is surprising given initial check and pharmacy readings.  Recommended getting a monitor for home and sending me some readings in a couple weeks.  Continue Hyzaar 100-12.5 mg daily.  Counseled on diet and exercise. Depo-Medrol injection today for sinus pain.  If no improvement in the next couple days, would consider 7 days of doxycycline.  He will send me a message if no better. The patient voiced understanding and agreement to the plan.  Jilda Roche [place], DO 01/16/23  8:10 AM

## 2023-01-21 ENCOUNTER — Encounter: Payer: Self-pay | Admitting: Family Medicine

## 2023-01-21 ENCOUNTER — Other Ambulatory Visit: Payer: Self-pay | Admitting: Family Medicine

## 2023-01-21 MED ORDER — AMLODIPINE BESYLATE 5 MG PO TABS
5.0000 mg | ORAL_TABLET | Freq: Every day | ORAL | 3 refills | Status: DC
Start: 2023-01-21 — End: 2024-01-28

## 2023-01-28 ENCOUNTER — Encounter: Payer: Self-pay | Admitting: Family Medicine

## 2023-01-28 ENCOUNTER — Ambulatory Visit (INDEPENDENT_AMBULATORY_CARE_PROVIDER_SITE_OTHER): Payer: 59 | Admitting: Family Medicine

## 2023-01-28 VITALS — BP 126/70 | HR 75 | Temp 98.0°F | Ht 70.0 in | Wt 243.5 lb

## 2023-01-28 DIAGNOSIS — I10 Essential (primary) hypertension: Secondary | ICD-10-CM

## 2023-01-28 NOTE — Progress Notes (Signed)
Chief Complaint  Patient presents with   Follow-up    Subjective David [name] is a 62 y.o. male who presents for hypertension follow up. He does monitor home blood pressures. Blood pressures ranging from 120-130's/70-80's on average. He is compliant with medications Norvasc 5 mg daily, Hyzaar 100-12.5 mg daily. Patient has these side effects of medication: none He is usually adhering to a healthy diet overall. Current exercise: walking, some core work  No CP or SOB.    Past Medical History:  Diagnosis Date   COPD (chronic obstructive pulmonary disease) (HCC)    (06-17-2019 per pt's pcp, dr Carmelia Roller ,  dx copd unspecified type 08-17-2016 note in epic PFTs showed some obstruction per note)   Hyperlipidemia    Hyperplasia of prostate with lower urinary tract symptoms (LUTS)    Hypertension    followed by pcp (06-17-2019 per pt never had stress test)   Prostate cancer [name]) urologist-- dr wrenn/  oncologist-- dr Kathrynn Running   dx 12-24-2018-- Stage T2a, Gleason 3+4   Seasonal allergies     Exam BP 126/70 (BP Location: Left Arm, Patient Position: Sitting, Cuff Size: Large)   Pulse 75   Temp 98 F (36.7 C) (Oral)   Ht 5\' 10"  (1.778 m)   Wt 243 lb 8 oz (110.5 kg)   SpO2 96%   BMI 34.94 kg/m  General:  well developed, well nourished, in no apparent distress Heart: RRR, no bruits, no LE edema Lungs: clear to auscultation, no accessory muscle use Psych: well oriented with normal range of affect and appropriate judgment/insight  Essential hypertension, benign  Chronic, stable.  Continue Norvasc 5 mg daily, Hyzaar 100-12.5 mg daily.  Counseled on diet and exercise.  If headaches return and blood pressure is normal, he will take daily magnesium and follow-up with [place].  If they return because of blood pressure, he will shift his amlodipine dosage to the evening. F/u as originally scheduled. The patient voiced understanding and agreement to the plan.  Jilda Roche [place],  DO 01/28/23  4:27 PM

## 2023-01-28 NOTE — Patient Instructions (Signed)
Keep the diet clean and stay active.  If your headaches come back and your blood pressure is normal ,consider taking a daily magnesium 200-400 mg daily and then following with me.   If your blood pressure spikes up in the morning and causes headaches again, take your amlodipine/Norvasc in the evening and continue Hyzaar/losartan-hydrochlorothiazide in the morning still.   Let us know if you need anything.

## 2023-04-22 ENCOUNTER — Other Ambulatory Visit: Payer: Self-pay | Admitting: Family Medicine

## 2023-04-22 DIAGNOSIS — R0981 Nasal congestion: Secondary | ICD-10-CM

## 2023-04-22 MED ORDER — FLUTICASONE PROPIONATE 50 MCG/ACT NA SUSP
2.0000 | Freq: Every day | NASAL | 6 refills | Status: DC
Start: 2023-04-22 — End: 2024-06-15

## 2023-06-07 ENCOUNTER — Ambulatory Visit: Payer: 59 | Admitting: Family Medicine

## 2023-07-09 ENCOUNTER — Ambulatory Visit: Payer: 59 | Admitting: Family Medicine

## 2023-07-15 ENCOUNTER — Encounter: Payer: Self-pay | Admitting: Family Medicine

## 2023-07-15 ENCOUNTER — Ambulatory Visit (INDEPENDENT_AMBULATORY_CARE_PROVIDER_SITE_OTHER): Payer: 59 | Admitting: Family Medicine

## 2023-07-15 VITALS — BP 128/76 | HR 89 | Temp 98.0°F | Resp 16 | Ht 70.0 in | Wt 249.2 lb

## 2023-07-15 DIAGNOSIS — Z1211 Encounter for screening for malignant neoplasm of colon: Secondary | ICD-10-CM

## 2023-07-15 DIAGNOSIS — E785 Hyperlipidemia, unspecified: Secondary | ICD-10-CM | POA: Diagnosis not present

## 2023-07-15 DIAGNOSIS — J4540 Moderate persistent asthma, uncomplicated: Secondary | ICD-10-CM | POA: Diagnosis not present

## 2023-07-15 DIAGNOSIS — I10 Essential (primary) hypertension: Secondary | ICD-10-CM | POA: Diagnosis not present

## 2023-07-15 NOTE — Patient Instructions (Addendum)
Keep the diet clean and stay active.  Please consider adding some weight resistance exercise to your routine. Consider yoga as well.   If you do not hear anything about your referral in the next 1-2 weeks, call our office and ask for an update.  The Shingrix vaccine (for shingles) is a 2 shot series spaced 2-6 months apart. It can make people feel low energy, achy and almost like they have the flu for 48 hours after injection. 1/5 people can have nausea and/or vomiting. Please plan accordingly when deciding on when to get this shot. Call our office for a nurse visit appointment to get this. The second shot of the series is less severe regarding the side effects, but it still lasts 48 hours.   Let us know if you need anything.

## 2023-07-15 NOTE — Progress Notes (Signed)
Chief Complaint  Patient presents with   Follow-up    Follow up    Subjective David [name] is a 62 y.o. male who presents for hypertension follow up. He does monitor home blood pressures. Blood pressures ranging from 120's/70's on average. He is compliant with medications- Hyzaar 100-12.5 mg daily. Patient has these side effects of medication: none He is usually adhering to a healthy diet overall. Current exercise: walking No CP or SOB.   Hyperlipidemia Patient presents for dyslipidemia follow up. Currently being treated with Zocor 20 mg daily and compliance with treatment thus far has been good. He denies myalgias. Diet/exercise as above. The patient is not known to have coexisting coronary artery disease.  Allergy induced asthma Taking Symbicort 2 puffs twice daily as needed.  Breathing is overall well-controlled.  No recent flares or requirements for hospitalization/prednisone use.   Past Medical History:  Diagnosis Date   COPD (chronic obstructive pulmonary disease) (HCC)    (06-17-2019 per pt's pcp, dr Carmelia Roller ,  dx copd unspecified type 08-17-2016 note in epic PFTs showed some obstruction per note)   Hyperlipidemia    Hyperplasia of prostate with lower urinary tract symptoms (LUTS)    Hypertension    followed by pcp (06-17-2019 per pt never had stress test)   Prostate cancer [facility]) urologist-- dr wrenn/  oncologist-- dr Kathrynn Running   dx 12-24-2018-- Stage T2a, Gleason 3+4   Seasonal allergies     Exam BP 128/76   Pulse 89   Temp 98 F (36.7 C) (Oral)   Resp 16   Ht 5\' 10"  (1.778 m)   Wt 249 lb 3.2 oz (113 kg)   SpO2 98%   BMI 35.76 kg/m  General:  well developed, well nourished, in no apparent distress Heart: RRR, no bruits, no LE edema Lungs: clear to auscultation, no accessory muscle use Psych: well oriented with normal range of affect and appropriate judgment/insight  Essential hypertension, benign  Hyperlipidemia, unspecified hyperlipidemia  type  Moderate persistent extrinsic asthma without complication  Colon cancer screening - Plan: Ambulatory referral to Gastroenterology  Chronic, stable.  Continue Hyzaar 100-12.5 mg daily.  Counseled on diet and exercise. Chronic, stable.  Continue Zocor 20 mg daily. Chronic, stable.  Continue Symbicort 2 puffs twice daily as needed. We have attempted multiple times to get him in with the GI team for colon cancer screening. Will place referral again. He is willing to have this done.  Shingrix recommended. F/u in 6 months. The patient voiced understanding and agreement to the plan.  Jilda Roche [place], DO 07/15/23  1:31 PM

## 2023-08-07 ENCOUNTER — Other Ambulatory Visit: Payer: Self-pay | Admitting: Family Medicine

## 2023-08-07 DIAGNOSIS — I10 Essential (primary) hypertension: Secondary | ICD-10-CM

## 2023-08-25 ENCOUNTER — Other Ambulatory Visit: Payer: Self-pay | Admitting: Family Medicine

## 2023-09-18 ENCOUNTER — Other Ambulatory Visit: Payer: Self-pay

## 2023-09-18 DIAGNOSIS — E785 Hyperlipidemia, unspecified: Secondary | ICD-10-CM

## 2023-09-18 MED ORDER — SIMVASTATIN 20 MG PO TABS
20.0000 mg | ORAL_TABLET | Freq: Every day | ORAL | 1 refills | Status: DC | PRN
Start: 2023-09-18 — End: 2024-03-04

## 2023-11-13 ENCOUNTER — Other Ambulatory Visit: Payer: Self-pay | Admitting: Family Medicine

## 2023-11-13 DIAGNOSIS — J4540 Moderate persistent asthma, uncomplicated: Secondary | ICD-10-CM

## 2023-11-13 NOTE — Telephone Encounter (Unsigned)
 Copied from CRM [id number]. Topic: Clinical - Medication Refill >> Nov 13, 2023  4:48 PM Tiffany S wrote: Most Recent Primary Care Visit:  Provider: Jobe Mulder  Department: [facility]  Visit Type: OFFICE VISIT  Date: 07/15/2023  Medication: budesonide-formoterol (SYMBICORT) 80-4.5 MCG/ACT inhaler [[id number]]  Has the patient contacted their pharmacy? Yes (Agent: If no, request that the patient contact the pharmacy for the refill. If patient does not wish to contact the pharmacy document the reason why and proceed with request.) (Agent: If yes, when and what did the pharmacy advise?)  Is this the correct pharmacy for this prescription? Yes If no, delete pharmacy and type the correct one.  This is the patient's preferred pharmacy:  [facility][id number] [place], [place] - [phone] AT [facility] OF [facility] & [address] [place] [address] Phone: [phone] Fax: [phone]    Has the prescription been filled recently? Yes  Is the patient out of the medication? Yes  Has the patient been seen for an appointment in the last year OR does the patient have an upcoming appointment? Yes  Can we respond through MyChart? Yes  Agent: Please be advised that Rx refills may take up to 3 business days. We ask that you follow-up with your pharmacy.

## 2023-11-14 MED ORDER — BUDESONIDE-FORMOTEROL FUMARATE 80-4.5 MCG/ACT IN AERO
2.0000 | INHALATION_SPRAY | Freq: Two times a day (BID) | RESPIRATORY_TRACT | 5 refills | Status: DC
Start: 2023-11-14 — End: 2024-01-21

## 2023-12-09 ENCOUNTER — Encounter: Payer: Self-pay | Admitting: Pediatrics

## 2024-01-06 ENCOUNTER — Encounter

## 2024-01-14 ENCOUNTER — Encounter: Payer: 59 | Admitting: Family Medicine

## 2024-01-20 ENCOUNTER — Encounter: Payer: Self-pay | Admitting: Pediatrics

## 2024-01-20 ENCOUNTER — Ambulatory Visit (AMBULATORY_SURGERY_CENTER)

## 2024-01-20 ENCOUNTER — Telehealth: Payer: Self-pay | Admitting: *Deleted

## 2024-01-20 VITALS — Ht 70.0 in | Wt 240.0 lb

## 2024-01-20 DIAGNOSIS — Z1211 Encounter for screening for malignant neoplasm of colon: Secondary | ICD-10-CM

## 2024-01-20 MED ORDER — NA SULFATE-K SULFATE-MG SULF 17.5-3.13-1.6 GM/177ML PO SOLN: 1.0000 | Freq: Once | ORAL | 0 refills | Status: AC

## 2024-01-20 NOTE — Progress Notes (Signed)
 Pre visit completed over telephone. Instructions sent through secure email and mychart    No egg or soy allergy known to patient  No issues known to pt with past sedation with any surgeries or procedures Patient denies ever being told they had issues or difficulty with intubation  No FH of Malignant Hyperthermia Pt is not on diet pills Pt is not on  home 02  Pt is not on blood thinners  Pt denies issues with constipation  No A fib or A flutter Have any cardiac testing pending-- NO Pt instructed to use [email or web address] or GoodRx for a price reduction on prep

## 2024-01-21 ENCOUNTER — Telehealth: Payer: Self-pay | Admitting: Family Medicine

## 2024-01-21 DIAGNOSIS — J4540 Moderate persistent asthma, uncomplicated: Secondary | ICD-10-CM

## 2024-01-21 MED ORDER — BUDESONIDE-FORMOTEROL FUMARATE 80-4.5 MCG/ACT IN AERO: 2.0000 | INHALATION_SPRAY | Freq: Two times a day (BID) | RESPIRATORY_TRACT | 0 refills | Status: DC

## 2024-01-21 NOTE — Telephone Encounter (Unsigned)
 Copied from CRM [id number]. Topic: Clinical - Medication Refill >> Jan 21, 2024  4:25 PM Avram MATSU wrote: Medication: budesonide -formoterol  (SYMBICORT ) 80-4.5 MCG/ACT inhaler [[id number]]  Has the patient contacted their pharmacy? Yes (Agent: If no, request that the patient contact the pharmacy for the refill. If patient does not wish to contact the pharmacy document the reason why and proceed with request.) (Agent: If yes, when and what did the pharmacy advise?)  This is the patient's preferred pharmacy:  [facility] #[id number] [place], [place] - [address] AT [facility] OF [facility] & [address] [place] [address] Phone: [phone] Fax: [phone]   Is this the correct pharmacy for this prescription? Yes If no, delete pharmacy and type the correct one.   Has the prescription been filled recently? No  Is the patient out of the medication? Yes  Has the patient been seen for an appointment in the last year OR does the patient have an upcoming appointment? Yes  Can we respond through MyChart? Yes  Agent: Please be advised that Rx refills may take up to 3 business days. We ask that you follow-up with your pharmacy.

## 2024-01-21 NOTE — Telephone Encounter (Signed)
 Patient returned call and PV completed.

## 2024-01-24 ENCOUNTER — Encounter: Admitting: Pediatrics

## 2024-01-28 ENCOUNTER — Other Ambulatory Visit: Payer: Self-pay | Admitting: Family Medicine

## 2024-01-28 ENCOUNTER — Ambulatory Visit: Payer: Self-pay | Admitting: Family Medicine

## 2024-01-28 ENCOUNTER — Encounter: Payer: Self-pay | Admitting: Family Medicine

## 2024-01-28 ENCOUNTER — Ambulatory Visit (INDEPENDENT_AMBULATORY_CARE_PROVIDER_SITE_OTHER): Admitting: Family Medicine

## 2024-01-28 VITALS — BP 142/80 | HR 86 | Temp 98.0°F | Resp 16 | Ht 70.0 in | Wt 248.0 lb

## 2024-01-28 DIAGNOSIS — E785 Hyperlipidemia, unspecified: Secondary | ICD-10-CM | POA: Diagnosis not present

## 2024-01-28 DIAGNOSIS — Z0000 Encounter for general adult medical examination without abnormal findings: Secondary | ICD-10-CM | POA: Diagnosis not present

## 2024-01-28 DIAGNOSIS — J4540 Moderate persistent asthma, uncomplicated: Secondary | ICD-10-CM | POA: Diagnosis not present

## 2024-01-28 DIAGNOSIS — Z1211 Encounter for screening for malignant neoplasm of colon: Secondary | ICD-10-CM

## 2024-01-28 LAB — COMPREHENSIVE METABOLIC PANEL WITH GFR
ALT: 19 U/L (ref 0–53)
AST: 23 U/L (ref 0–37)
Albumin: 4.6 g/dL (ref 3.5–5.2)
Alkaline Phosphatase: 76 U/L (ref 39–117)
BUN: 12 mg/dL (ref 6–23)
CO2: 32 meq/L (ref 19–32)
Calcium: 9.7 mg/dL (ref 8.4–10.5)
Chloride: 99 meq/L (ref 96–112)
Creatinine, Ser: 1.15 mg/dL (ref 0.40–1.50)
GFR: 69.03 mL/min (ref >60.00)
Glucose, Bld: 103 mg/dL — ABNORMAL HIGH (ref 70–99)
Potassium: 4.3 meq/L (ref 3.5–5.1)
Sodium: 137 meq/L (ref 135–145)
Total Bilirubin: 1.4 mg/dL — ABNORMAL HIGH (ref 0.2–1.2)
Total Protein: 7.1 g/dL (ref 6.0–8.3)

## 2024-01-28 LAB — CBC
HCT: 46.5 % (ref 39.0–52.0)
Hemoglobin: 15 g/dL (ref 13.0–17.0)
MCHC: 32.3 g/dL (ref 30.0–36.0)
MCV: 82.3 fl (ref 78.0–100.0)
Platelets: 252 10*3/uL (ref 150.0–400.0)
RBC: 5.64 Mil/uL (ref 4.22–5.81)
RDW: 14.4 % (ref 11.5–15.5)
WBC: 4 10*3/uL (ref 4.0–10.5)

## 2024-01-28 LAB — LIPID PANEL
Cholesterol: 163 mg/dL (ref 0–200)
HDL: 40.8 mg/dL (ref >39.00)
LDL Cholesterol: 103 mg/dL — ABNORMAL HIGH (ref 0–99)
NonHDL: 122.49
Total CHOL/HDL Ratio: 4
Triglycerides: 98 mg/dL (ref 0.0–149.0)
VLDL: 19.6 mg/dL (ref 0.0–40.0)

## 2024-01-28 MED ORDER — BUDESONIDE-FORMOTEROL FUMARATE 80-4.5 MCG/ACT IN AERO: 2.0000 | INHALATION_SPRAY | Freq: Two times a day (BID) | RESPIRATORY_TRACT | 0 refills | Status: DC

## 2024-01-28 MED ORDER — BUDESONIDE-FORMOTEROL FUMARATE 80-4.5 MCG/ACT IN AERO: 2.0000 | INHALATION_SPRAY | Freq: Two times a day (BID) | RESPIRATORY_TRACT | 12 refills | Status: DC

## 2024-01-28 NOTE — Progress Notes (Signed)
 Chief Complaint  Patient presents with   Annual Exam    CPE    [name]   His last physical was >1 year ago.  Current diet: in general, a healthy diet.  Current exercise: walking Weight trend: stable Fatigue out of ordinary? No. Seat belt? Yes.   Advanced directive? No  Health maintenance Shingrix- No Colonoscopy- No- scheduled Tetanus- Yes HIV- Yes Hep C- Yes Due for PCV20   Past Medical History:  Diagnosis Date   COPD (chronic obstructive pulmonary disease) (HCC)    (06-17-2019 per pt's pcp, dr David [name] ,  dx copd unspecified type 08-17-2016 note in epic PFTs showed some obstruction per note)   Hyperlipidemia    Hyperplasia of prostate with lower urinary tract symptoms (LUTS)    Hypertension    followed by pcp (06-17-2019 per pt never had stress test)   Prostate cancer [facility]) urologist-- dr David [name]/  oncologist-- dr David [name]   dx 12-24-2018-- Stage T2a, Gleason 3+4   Seasonal allergies       Past Surgical History:  Procedure Laterality Date   CYSTOSCOPY N/A 06/18/2019   Procedure: CYSTOSCOPY;  Surgeon: David Rush, MD;  Location: [facility];  Service: Urology;  Laterality: N/A;  No seeds detected in bladder per Dr. Watt   NO PAST SURGERIES     PROSTATE BIOPSY  12-24-2018  dr David office   RADIOACTIVE SEED IMPLANT N/A 06/18/2019   Procedure: RADIOACTIVE SEED IMPLANT/BRACHYTHERAPY IMPLANT;  Surgeon: David Rush, MD;  Location: [facility];  Service: Urology;  Laterality: N/A;   SPACE OAR INSTILLATION N/A 06/18/2019   Procedure: SPACE OAR INSTILLATION;  Surgeon: David Rush, MD;  Location: [facility];  Service: Urology;  Laterality: N/A;    Medications  Current Outpatient Medications on File Prior to Visit  Medication Sig Dispense Refill   fluticasone  (FLONASE ) 50 MCG/ACT nasal spray Place 2 sprays into both nostrils daily. 16 g 6   losartan -hydrochlorothiazide (HYZAAR)  100-12.5 MG tablet Take 1 tablet by mouth daily. 90 tablet 1   sildenafil  (VIAGRA ) 100 MG tablet TAKE HALF TO ONE TABLET BY MOUTH DAILY AS NEEDED FOR ERECTILE DYSFUNCTION 30 tablet 0   simvastatin  (ZOCOR ) 20 MG tablet Take 1 tablet (20 mg total) by mouth daily as needed. 90 tablet 1   No current facility-administered medications on file prior to visit.     Allergies No Known Allergies  Family History Family History  Problem Relation Age of Onset   Diabetes Mother        Living   Hypertension Father    Diabetes Father        Deceased   Lung cancer Father 10   Diabetes Sister        #1-Deceased   Hypertension Sister        #2   Diabetes Maternal Grandmother        Deceased   Hypertension Maternal Grandfather    Stroke Maternal Grandfather        Deceased   Healthy Daughter        x1   Diabetes Other        Maternal Aunts & Uncles [8]   Hypertension Other        Paternal Aunts & Uncles   Breast cancer Neg Hx    Colon cancer Neg Hx    Pancreatic cancer Neg Hx    Colon polyps Neg Hx     Review of Systems:  Constitutional:  no fevers Eye:  no recent significant change in vision Ear/Nose/Mouth/Throat:  Ears:  no hearing loss Nose/Mouth/Throat:  no complaints of nasal congestion, no sore throat Cardiovascular:  no chest pain Respiratory:  no shortness of breath Gastrointestinal:  no change in bowel habits GU:  Male: negative for dysuria, frequency Musculoskeletal/Extremities:  no joint pain Integumentary (Skin/Breast):  no abnormal skin lesions reported Neurologic:  no headaches Endocrine: No unexpected weight changes Hematologic/Lymphatic:  no abnormal bleeding  Exam BP (!) 142/80 (BP Location: Left Arm, Patient Position: Sitting)   Pulse 86   Temp 98 F (36.7 C) (Oral)   Resp 16   Ht 5' 10 (1.778 m)   Wt 248 lb (112.5 kg)   SpO2 96%   BMI 35.58 kg/m  General:  well developed, well nourished, in no apparent distress Skin:  no significant moles, warts, or  growths Head:  no masses, lesions, or tenderness Eyes:  pupils equal and round, sclera anicteric without injection Ears:  canals without lesions, TMs shiny without retraction, no obvious effusion, no erythema Nose:  nares patent, mucosa normal Throat/Pharynx:  lips and gingiva without lesion; tongue and uvula midline; non-inflamed pharynx; no exudates or postnasal drainage Neck: neck supple without adenopathy, thyromegaly, or masses Cardiac: RRR, no bruits, no LE edema Lungs:  clear to auscultation, breath sounds equal bilaterally, no respiratory distress Abdomen: BS+, soft, non-tender, non-distended, no masses or organomegaly noted Rectal: Deferred Musculoskeletal:  symmetrical muscle groups noted without atrophy or deformity Neuro:  gait normal; deep tendon reflexes normal and symmetric Psych: well oriented with normal range of affect and appropriate judgment/insight  Assessment and Plan  Well adult exam - Plan: CBC, Comprehensive metabolic panel with GFR, Lipid panel  Colon cancer screening  Moderate persistent extrinsic asthma without complication - Plan: budesonide -formoterol  (SYMBICORT ) 80-4.5 MCG/ACT inhaler   Well 63 y.o. male. Counseled on diet and exercise. Pt has hx of prostate cancer, following w urology.  CCS: Scheduled in 9 d.  PCV20, will return to get this in a few weeks.  Advanced directive form provided today.  Immunizations, labs, and further orders as above. Follow up in 6 mo. The patient voiced understanding and agreement to the plan.  David [place], DO 01/28/24 8:40 AM

## 2024-01-28 NOTE — Patient Instructions (Addendum)
 Give [place] 2-3 business days to get the results of your labs back.   Keep the diet clean and stay active.  Please consider adding some weight resistance exercise to your routine. Consider yoga as well.   Please get me a copy of your advanced directive form at your convenience.   The Shingrix vaccine (for shingles) is a 2 shot series spaced 2-6 months apart. It can make people feel low energy, achy and almost like they have the flu for 48 hours after injection. 1/5 people can have nausea and/or vomiting. Please plan accordingly when deciding on when to get this shot. Call our office for a nurse visit appointment to get this. The second shot of the series is less severe regarding the side effects, but it still lasts 48 hours.   Let us know if you need anything.

## 2024-02-03 NOTE — Progress Notes (Unsigned)
 [facility] History and Physical   Primary Care Physician:  Frann Mabel Mt, DO   Reason for Procedure:  Colorectal cancer screening  Plan:    Screening colonoscopy     HPI: David [name] is a 63 y.o. male undergoing screening colonoscopy for colorectal cancer screening.  This is the patient's first colonoscopy.  No documented family history of colorectal cancer or polyps.  The patient does have a personal history of prostate cancer status post radioactive seed implant and completed treatment in the past.  No current symptoms of rectal bleeding or change in bowel habits.   Past Medical History:  Diagnosis Date   COPD (chronic obstructive pulmonary disease) (HCC)    (06-17-2019 per pt's pcp, dr frann ,  dx copd unspecified type 08-17-2016 note in epic PFTs showed some obstruction per note)   Hyperlipidemia    Hyperplasia of prostate with lower urinary tract symptoms (LUTS)    Hypertension    followed by pcp (06-17-2019 per pt never had stress test)   Prostate cancer [facility]) urologist-- dr wrenn/  oncologist-- dr patrcia   dx 12-24-2018-- Stage T2a, Gleason 3+4   Seasonal allergies     Past Surgical History:  Procedure Laterality Date   CYSTOSCOPY N/A 06/18/2019   Procedure: CYSTOSCOPY;  Surgeon: Watt Rush, MD;  Location: [facility];  Service: Urology;  Laterality: N/A;  No seeds detected in bladder per Dr. Watt   NO PAST SURGERIES     PROSTATE BIOPSY  12-24-2018  dr watt office   RADIOACTIVE SEED IMPLANT N/A 06/18/2019   Procedure: RADIOACTIVE SEED IMPLANT/BRACHYTHERAPY IMPLANT;  Surgeon: Watt Rush, MD;  Location: [facility];  Service: Urology;  Laterality: N/A;   SPACE OAR INSTILLATION N/A 06/18/2019   Procedure: SPACE OAR INSTILLATION;  Surgeon: Watt Rush, MD;  Location: [facility];  Service: Urology;  Laterality: N/A;    Prior to Admission medications   Medication Sig Start Date End Date Taking?  Authorizing Provider  budesonide -formoterol  (SYMBICORT ) 80-4.5 MCG/ACT inhaler Inhale 2 puffs into the lungs 2 (two) times daily. 01/28/24   Frann Mabel Mt, DO  fluticasone  (FLONASE ) 50 MCG/ACT nasal spray Place 2 sprays into both nostrils daily. 04/22/23   Frann Mabel Mt, DO  losartan -hydrochlorothiazide (HYZAAR) 100-12.5 MG tablet Take 1 tablet by mouth daily. 08/08/23   Frann Mabel Mt, DO  sildenafil  (VIAGRA ) 100 MG tablet TAKE HALF TO ONE TABLET BY MOUTH DAILY AS NEEDED FOR ERECTILE DYSFUNCTION 08/26/23   Frann Mabel Mt, DO  simvastatin  (ZOCOR ) 20 MG tablet Take 1 tablet (20 mg total) by mouth daily as needed. 09/18/23   Frann Mabel Mt, DO    Current Outpatient Medications  Medication Sig Dispense Refill   budesonide -formoterol  (SYMBICORT ) 80-4.5 MCG/ACT inhaler Inhale 2 puffs into the lungs 2 (two) times daily. 10.2 g 12   fluticasone  (FLONASE ) 50 MCG/ACT nasal spray Place 2 sprays into both nostrils daily. 16 g 6   losartan -hydrochlorothiazide (HYZAAR) 100-12.5 MG tablet Take 1 tablet by mouth daily. 90 tablet 1   simvastatin  (ZOCOR ) 20 MG tablet Take 1 tablet (20 mg total) by mouth daily as needed. 90 tablet 1   sildenafil  (VIAGRA ) 100 MG tablet TAKE HALF TO ONE TABLET BY MOUTH DAILY AS NEEDED FOR ERECTILE DYSFUNCTION 30 tablet 0   Current Facility-Administered Medications  Medication Dose Route Frequency Provider Last Rate Last Admin   0.9 %  sodium chloride  infusion  500 mL Intravenous Continuous Norvella Loscalzo, Inocente HERO, MD  Allergies as of 02/06/2024   (Not on File)    Family History  Problem Relation Age of Onset   Diabetes Mother        Living   Hypertension Father    Diabetes Father        Deceased   Lung cancer Father 39   Diabetes Sister        #1-Deceased   Hypertension Sister        #2   Diabetes Maternal Grandmother        Deceased   Hypertension Maternal Grandfather    Stroke Maternal Grandfather        Deceased    Healthy Daughter        x1   Diabetes Other        Maternal Aunts & Uncles [8]   Hypertension Other        Paternal Aunts & Uncles   Breast cancer Neg Hx    Colon cancer Neg Hx    Pancreatic cancer Neg Hx    Colon polyps Neg Hx     Social History   Socioeconomic History   Marital status: Married    Spouse name: Not on file   Number of children: 2   Years of education: Not on file   Highest education level: Bachelor's degree (e.g., BA, AB, BS)  Occupational History   Not on file  Tobacco Use   Smoking status: Never   Smokeless tobacco: Never  Vaping Use   Vaping status: Never Used  Substance and Sexual Activity   Alcohol use: Yes    Alcohol/week: 0.0 standard drinks of alcohol    Comment: occasional   Drug use: Never   Sexual activity: Yes  Other Topics Concern   Not on file  Social History Narrative   Not on file   Social Drivers of Health   Financial Resource Strain: Low Risk  (01/22/2024)   Overall Financial Resource Strain (CARDIA)    Difficulty of Paying Living Expenses: Not hard at all  Food Insecurity: No Food Insecurity (01/22/2024)   Hunger Vital Sign    Worried About Running Out of Food in the Last Year: Never true    Ran Out of Food in the Last Year: Never true  Transportation Needs: No Transportation Needs (01/22/2024)   PRAPARE - Administrator, Civil Service (Medical): No    Lack of Transportation (Non-Medical): No  Physical Activity: Insufficiently Active (01/22/2024)   Exercise Vital Sign    Days of Exercise per Week: 4 days    Minutes of Exercise per Session: 30 min  Stress: No Stress Concern Present (01/22/2024)   [employer] of Occupational Health - Occupational Stress Questionnaire    Feeling of Stress: Not at all  Social Connections: Moderately Integrated (01/22/2024)   Social Connection and Isolation Panel    Frequency of Communication with Friends and Family: Twice a week    Frequency of Social Gatherings with Friends and  Family: Once a week    Attends Religious Services: More than 4 times per year    Active Member of [employer] or Organizations: No    Attends Engineer, structural: Not on file    Marital Status: Married  Catering manager Violence: Not on file    Review of Systems:  All other review of systems negative except as mentioned in the HPI.  Physical Exam: Vital signs BP (!) 165/95   Pulse 77   Temp 98.1 F (36.7 C)   Resp  14   Ht 5' 10 (1.778 m)   Wt 240 lb (108.9 kg)   SpO2 98%   BMI 34.44 kg/m   General:   Alert,  Well-developed, well-nourished, pleasant and cooperative in NAD Airway:  Mallampati 1 Lungs:  Clear throughout to auscultation.   Heart:  Regular rate and rhythm; no murmurs, clicks, rubs,  or gallops. Abdomen:  Soft, nontender and nondistended. Normal bowel sounds.   Neuro/Psych:  Normal mood and affect. A and O x 3  Inocente Hausen, MD [facility]

## 2024-02-06 ENCOUNTER — Encounter: Payer: Self-pay | Admitting: Pediatrics

## 2024-02-06 ENCOUNTER — Ambulatory Visit: Admitting: Pediatrics

## 2024-02-06 VITALS — BP 97/64 | HR 74 | Temp 98.1°F | Resp 13 | Ht 70.0 in | Wt 240.0 lb

## 2024-02-06 DIAGNOSIS — Z1211 Encounter for screening for malignant neoplasm of colon: Secondary | ICD-10-CM

## 2024-02-06 DIAGNOSIS — D128 Benign neoplasm of rectum: Secondary | ICD-10-CM

## 2024-02-06 DIAGNOSIS — D123 Benign neoplasm of transverse colon: Secondary | ICD-10-CM | POA: Diagnosis not present

## 2024-02-06 DIAGNOSIS — D125 Benign neoplasm of sigmoid colon: Secondary | ICD-10-CM | POA: Diagnosis not present

## 2024-02-06 DIAGNOSIS — D127 Benign neoplasm of rectosigmoid junction: Secondary | ICD-10-CM | POA: Diagnosis not present

## 2024-02-06 DIAGNOSIS — K648 Other hemorrhoids: Secondary | ICD-10-CM | POA: Diagnosis not present

## 2024-02-06 MED ORDER — SODIUM CHLORIDE 0.9 % IV SOLN: 500.0000 mL | INTRAVENOUS | Status: DC

## 2024-02-06 NOTE — Progress Notes (Signed)
 Report given to PACU, vss

## 2024-02-06 NOTE — Patient Instructions (Signed)

## 2024-02-06 NOTE — Progress Notes (Signed)
 Pt's states no medical or surgical changes since previsit or office visit.

## 2024-02-06 NOTE — Op Note (Signed)
 [facility] Patient Name: David [name] Procedure Date: 02/06/2024 8:18 AM MRN: [id number] Endoscopist: Inocente Hausen , MD, [id number] Age: 63 Referring MD:  Date of Birth: 1960/09/22 Gender: Male Account #: [id number] Procedure:                Colonoscopy Indications:              Screening for colorectal malignant neoplasm, This                            is the patient's first colonoscopy Medicines:                Monitored Anesthesia Care Procedure:                Pre-Anesthesia Assessment:                           - Prior to the procedure, a History and Physical                            was performed, and patient medications and                            allergies were reviewed. The patient's tolerance of                            previous anesthesia was also reviewed. The risks                            and benefits of the procedure and the sedation                            options and risks were discussed with the patient.                            All questions were answered, and informed consent                            was obtained. Prior Anticoagulants: The patient has                            taken no anticoagulant or antiplatelet agents. ASA                            Grade Assessment: III - A patient with severe                            systemic disease. After reviewing the risks and                            benefits, the patient was deemed in satisfactory                            condition to undergo the procedure.  After obtaining informed consent, the colonoscope                            was passed under direct vision. Throughout the                            procedure, the patient's blood pressure, pulse, and                            oxygen saturations were monitored continuously. The                            CF [id number] #[id number] was introduced through the anus                            and advanced to the  cecum, identified by                            appendiceal orifice and ileocecal valve. The                            colonoscopy was performed without difficulty. The                            patient tolerated the procedure well. The quality                            of the bowel preparation was good. The ileocecal                            valve, appendiceal orifice, and rectum were                            photographed. Scope In: 8:29:41 AM Scope Out: 8:49:16 AM Scope Withdrawal Time: 0 hours 14 minutes 49 seconds  Total Procedure Duration: 0 hours 19 minutes 35 seconds  Findings:                 The perianal and digital rectal examinations were                            normal. Pertinent negatives include normal                            sphincter tone and no palpable rectal lesions.                           Four sessile polyps were found in the rectum,                            sigmoid colon and transverse colon. The polyps were                            3 to 4 mm in size. These polyps were removed with a  cold biopsy forceps. Resection and retrieval were                            complete.                           A 6 mm polyp was found in the rectum. The polyp was                            sessile. The polyp was removed with a cold snare.                            Resection and retrieval were complete.                           Internal hemorrhoids were found during retroflexion. Complications:            No immediate complications. Estimated blood loss:                            Minimal. Estimated Blood Loss:     Estimated blood loss was minimal. Impression:               - Four 3 to 4 mm polyps in the rectum, in the                            sigmoid colon and in the transverse colon, removed                            with a cold biopsy forceps. Resected and retrieved.                           - One 6 mm polyp in the rectum, removed  with a cold                            snare. Resected and retrieved.                           - Internal hemorrhoids. Recommendation:           - Discharge patient to home (ambulatory).                           - Await pathology results.                           - Repeat colonoscopy for surveillance based on                            pathology results.                           - The findings and recommendations were discussed                            with the patient's family.                           -  Return to referring physician.                           - Patient has a contact number available for                            emergencies. The signs and symptoms of potential                            delayed complications were discussed with the                            patient. Return to normal activities tomorrow.                            Written discharge instructions were provided to the                            patient. Inocente Hausen, MD 02/06/2024 8:54:39 AM This report has been signed electronically.

## 2024-02-06 NOTE — Progress Notes (Signed)
 Called to room to assist during endoscopic procedure.  Patient ID and intended procedure confirmed with present staff. Received instructions for my participation in the procedure from the performing physician.

## 2024-02-07 ENCOUNTER — Telehealth: Payer: Self-pay

## 2024-02-07 NOTE — Telephone Encounter (Signed)
 Left message on answering machine.

## 2024-02-12 ENCOUNTER — Telehealth: Payer: Self-pay

## 2024-02-12 DIAGNOSIS — I10 Essential (primary) hypertension: Secondary | ICD-10-CM

## 2024-02-12 LAB — SURGICAL PATHOLOGY

## 2024-02-12 NOTE — Telephone Encounter (Signed)
 Initial Comment Caller states that he needs an Rx refill that needs physician approval. Translation No Disp. Time Titus Time) Disposition Final User 02/11/2024 8:18:35 PM Send To Nurse Acey Keto, RN, Greg 02/11/2024 11:07:08 PM FINAL ATTEMPT MADE - message left Veronia OBIE Pao 02/11/2024 11:07:13 PM Send to RN Final Attempt Vallery Veronia, RN, Pao 02/12/2024 6:09:05 AM Attempt made - message left Dew, RN, Jon 02/12/2024 6:29:26 AM FINAL ATTEMPT MADE - message left Yes Marea, RN, Jon Final Disposition 02/12/2024 6:29:26 AM FINAL ATTEMPT MADE - message left Yes Dew, RN, Jon

## 2024-02-12 NOTE — Telephone Encounter (Signed)
Sent pt message about refill.

## 2024-02-13 ENCOUNTER — Ambulatory Visit: Payer: Self-pay | Admitting: Pediatrics

## 2024-02-13 MED ORDER — LOSARTAN POTASSIUM-HCTZ 100-12.5 MG PO TABS: 1.0000 | ORAL_TABLET | Freq: Every day | ORAL | 2 refills | Status: DC

## 2024-02-13 NOTE — Telephone Encounter (Signed)
 Rx sent.

## 2024-02-13 NOTE — Telephone Encounter (Signed)
 Copied from CRM [id number]. Topic: Clinical - Medication Question >> Feb 13, 2024  8:44 AM Revonda D wrote: Reason for CRM: Pt stated that he needs his medication refilled for the losartan -hydrochlorothiazide (HYZAAR) 100-12.5 MG tablet. Pt stated that the pharmacy has been sending over requests but hasn't received anything from the office. Pt stated that he needs the medication today and would like a callback with an update.

## 2024-02-18 ENCOUNTER — Ambulatory Visit

## 2024-02-24 ENCOUNTER — Ambulatory Visit

## 2024-03-03 ENCOUNTER — Ambulatory Visit

## 2024-03-04 ENCOUNTER — Other Ambulatory Visit: Payer: Self-pay | Admitting: Family Medicine

## 2024-03-04 DIAGNOSIS — E785 Hyperlipidemia, unspecified: Secondary | ICD-10-CM

## 2024-05-19 ENCOUNTER — Other Ambulatory Visit: Payer: Self-pay | Admitting: Family Medicine

## 2024-06-13 ENCOUNTER — Other Ambulatory Visit: Payer: Self-pay | Admitting: Family Medicine

## 2024-06-13 DIAGNOSIS — R0981 Nasal congestion: Secondary | ICD-10-CM

## 2024-07-31 ENCOUNTER — Ambulatory Visit: Admitting: Family Medicine

## 2024-08-14 ENCOUNTER — Encounter: Payer: Self-pay | Admitting: Family Medicine

## 2024-08-14 ENCOUNTER — Ambulatory Visit: Admitting: Family Medicine

## 2024-08-14 ENCOUNTER — Ambulatory Visit: Payer: Self-pay | Admitting: Family Medicine

## 2024-08-14 VITALS — BP 128/74 | HR 84 | Temp 98.5°F | Resp 16 | Ht 70.0 in | Wt 246.0 lb

## 2024-08-14 DIAGNOSIS — E7800 Pure hypercholesterolemia, unspecified: Secondary | ICD-10-CM

## 2024-08-14 DIAGNOSIS — J4540 Moderate persistent asthma, uncomplicated: Secondary | ICD-10-CM | POA: Diagnosis not present

## 2024-08-14 DIAGNOSIS — R7303 Prediabetes: Secondary | ICD-10-CM

## 2024-08-14 DIAGNOSIS — Z23 Encounter for immunization: Secondary | ICD-10-CM

## 2024-08-14 DIAGNOSIS — I10 Essential (primary) hypertension: Secondary | ICD-10-CM | POA: Diagnosis not present

## 2024-08-14 LAB — LIPID PANEL
Cholesterol: 155 mg/dL (ref 28–200)
HDL: 38.5 mg/dL — ABNORMAL LOW
LDL Cholesterol: 102 mg/dL — ABNORMAL HIGH (ref 10–99)
NonHDL: 116.43
Total CHOL/HDL Ratio: 4
Triglycerides: 74 mg/dL (ref 10.0–149.0)
VLDL: 14.8 mg/dL (ref 0.0–40.0)

## 2024-08-14 LAB — COMPREHENSIVE METABOLIC PANEL WITH GFR
ALT: 17 U/L (ref 3–53)
AST: 22 U/L (ref 5–37)
Albumin: 4.8 g/dL (ref 3.5–5.2)
Alkaline Phosphatase: 81 U/L (ref 39–117)
BUN: 14 mg/dL (ref 6–23)
CO2: 32 meq/L (ref 19–32)
Calcium: 9.4 mg/dL (ref 8.4–10.5)
Chloride: 100 meq/L (ref 96–112)
Creatinine, Ser: 1.09 mg/dL (ref 0.40–1.50)
GFR: 73.33 mL/min
Glucose, Bld: 94 mg/dL (ref 70–99)
Potassium: 4.1 meq/L (ref 3.5–5.1)
Sodium: 138 meq/L (ref 135–145)
Total Bilirubin: 1.2 mg/dL (ref 0.2–1.2)
Total Protein: 7.4 g/dL (ref 6.0–8.3)

## 2024-08-14 LAB — HEMOGLOBIN A1C: Hgb A1c MFr Bld: 6.4 % (ref 4.6–6.5)

## 2024-08-14 MED ORDER — BUDESONIDE-FORMOTEROL FUMARATE 80-4.5 MCG/ACT IN AERO: INHALATION_SPRAY | RESPIRATORY_TRACT | Status: AC

## 2024-08-14 MED ORDER — AIRSUPRA 90-80 MCG/ACT IN AERO: 2.0000 | INHALATION_SPRAY | Freq: Four times a day (QID) | RESPIRATORY_TRACT | 2 refills | Status: AC | PRN

## 2024-08-14 NOTE — Addendum Note (Signed)
 Addended by: Ayeza Therriault M on: 08/14/2024 10:09 AM   Modules accepted: Orders

## 2024-08-14 NOTE — Progress Notes (Signed)
 Chief Complaint  Patient presents with   Follow-up    Follow Up    Subjective David [name] is a 64 y.o. male who presents for hypertension follow up. He does monitor home blood pressures. Blood pressures ranging from 120's/70's on average. He is compliant with medications-Hyzaar 100-12.5 mg daily. Patient has these side effects of medication: none He is adhering to a healthy diet overall. Current exercise: walking, strength training No CP or SOB.   Hyperlipidemia Patient presents for hyperlipidemia follow up. Currently being treated with Zocor  20 mg daily and compliance with treatment thus far has been good. He denies myalgias. Diet/exercise as above. The patient is not known to have coexisting coronary artery disease.  Asthma    Past Medical History:  Diagnosis Date   COPD (chronic obstructive pulmonary disease) (HCC)    (06-17-2019 per pt's pcp, dr frann ,  dx copd unspecified type 08-17-2016 note in epic PFTs showed some obstruction per note)   Hyperlipidemia    Hyperplasia of prostate with lower urinary tract symptoms (LUTS)    Hypertension    followed by pcp (06-17-2019 per pt never had stress test)   Prostate cancer [facility]) urologist-- dr wrenn/  oncologist-- dr patrcia   dx 12-24-2018-- Stage T2a, Gleason 3+4   Seasonal allergies     Exam BP 128/74 (BP Location: Left Arm, Patient Position: Sitting)   Pulse 84   Temp 98.5 F (36.9 C) (Oral)   Resp 16   Ht 5' 10 (1.778 m)   Wt 246 lb (111.6 kg)   SpO2 96%   BMI 35.30 kg/m  General:  well developed, well nourished, in no apparent distress Heart: RRR, no bruits, no LE edema Lungs: clear to auscultation, no accessory muscle use Psych: well oriented with normal range of affect and appropriate judgment/insight  Essential hypertension, benign  Pure hypercholesterolemia - Plan: Lipid panel, Comprehensive metabolic panel with GFR  Moderate persistent extrinsic asthma without complication - Plan:  budesonide -formoterol  (SYMBICORT ) 80-4.5 MCG/ACT inhaler  Prediabetes - Plan: Hemoglobin A1c  Chronic, stable.  Continue Hyzaar 100-12.5 mg daily.  Counseled on diet and exercise. Chronic, stable.  Continue Zocor  20 mg daily. Chronic, stable. Add Airsupra  for prn use. Cont Symbicort  for yellow zone usage.  PCV 20 today. Follow A1c. F/u in 6 mo. The patient voiced understanding and agreement to the plan.  Mabel Mt Vazquez, DO 08/14/24  10:00 AM

## 2024-08-14 NOTE — Patient Instructions (Signed)
 Give us  2-3 business days to get the results of your labs back.   Keep the diet clean and stay active.  Let us  know if you need anything.

## 2024-08-31 ENCOUNTER — Other Ambulatory Visit: Payer: Self-pay | Admitting: Family Medicine

## 2024-08-31 DIAGNOSIS — I10 Essential (primary) hypertension: Secondary | ICD-10-CM

## 2025-02-12 ENCOUNTER — Encounter: Admitting: Family Medicine
